# Patient Record
Sex: Male | Born: 1955 | Race: Asian | Hispanic: No | Marital: Married | Smoking: Never smoker
Health system: Southern US, Community
[De-identification: ages and names within clinical notes are randomized; demographics above are authoritative.]

## PROBLEM LIST (undated history)

## (undated) ENCOUNTER — Ambulatory Visit

## (undated) ENCOUNTER — Encounter

## (undated) DIAGNOSIS — E785 Hyperlipidemia, unspecified: Secondary | ICD-10-CM

## (undated) DIAGNOSIS — K219 Gastro-esophageal reflux disease without esophagitis: Secondary | ICD-10-CM

## (undated) DIAGNOSIS — I2510 Atherosclerotic heart disease of native coronary artery without angina pectoris: Secondary | ICD-10-CM

## (undated) HISTORY — DX: Atherosclerotic heart disease of native coronary artery without angina pectoris: I25.10

## (undated) HISTORY — DX: Hyperlipidemia, unspecified: E78.5

## (undated) HISTORY — PX: DOPPLER ECHOCARDIOGRAPHY: SHX263

## (undated) HISTORY — DX: Gastro-esophageal reflux disease without esophagitis: K21.9

---

## 2005-09-25 ENCOUNTER — Ambulatory Visit: Payer: Self-pay | Admitting: Nurse Practitioner

## 2005-09-29 ENCOUNTER — Ambulatory Visit: Payer: Self-pay | Admitting: *Deleted

## 2005-10-10 ENCOUNTER — Ambulatory Visit: Payer: Self-pay | Admitting: Nurse Practitioner

## 2009-01-12 HISTORY — PX: OTHER SURGICAL HISTORY: SHX169

## 2009-01-26 ENCOUNTER — Telehealth (INDEPENDENT_AMBULATORY_CARE_PROVIDER_SITE_OTHER): Payer: Self-pay | Admitting: *Deleted

## 2009-01-27 ENCOUNTER — Inpatient Hospital Stay (HOSPITAL_COMMUNITY): Admission: AD | Admit: 2009-01-27 | Discharge: 2009-02-06 | Payer: Self-pay | Admitting: Cardiology

## 2009-01-27 ENCOUNTER — Encounter: Payer: Self-pay | Admitting: Internal Medicine

## 2009-01-27 ENCOUNTER — Ambulatory Visit: Payer: Self-pay | Admitting: Cardiology

## 2009-01-27 ENCOUNTER — Ambulatory Visit: Payer: Self-pay

## 2009-01-27 DIAGNOSIS — R079 Chest pain, unspecified: Secondary | ICD-10-CM | POA: Insufficient documentation

## 2009-01-27 DIAGNOSIS — I200 Unstable angina: Secondary | ICD-10-CM | POA: Insufficient documentation

## 2009-01-28 ENCOUNTER — Encounter: Payer: Self-pay | Admitting: Cardiology

## 2009-01-28 ENCOUNTER — Ambulatory Visit: Payer: Self-pay | Admitting: Cardiothoracic Surgery

## 2009-01-29 ENCOUNTER — Encounter: Payer: Self-pay | Admitting: Cardiothoracic Surgery

## 2009-01-29 ENCOUNTER — Encounter: Payer: Self-pay | Admitting: Cardiology

## 2009-02-22 ENCOUNTER — Telehealth: Payer: Self-pay | Admitting: Cardiology

## 2009-02-23 ENCOUNTER — Ambulatory Visit: Payer: Self-pay | Admitting: Cardiology

## 2009-02-23 DIAGNOSIS — I25810 Atherosclerosis of coronary artery bypass graft(s) without angina pectoris: Secondary | ICD-10-CM | POA: Insufficient documentation

## 2009-02-23 DIAGNOSIS — E785 Hyperlipidemia, unspecified: Secondary | ICD-10-CM | POA: Insufficient documentation

## 2009-02-24 ENCOUNTER — Encounter: Payer: Self-pay | Admitting: Cardiology

## 2009-03-01 ENCOUNTER — Encounter: Admission: RE | Admit: 2009-03-01 | Discharge: 2009-03-01 | Payer: Self-pay | Admitting: Cardiothoracic Surgery

## 2009-03-01 ENCOUNTER — Ambulatory Visit: Payer: Self-pay | Admitting: Cardiothoracic Surgery

## 2009-03-01 ENCOUNTER — Encounter: Payer: Self-pay | Admitting: Cardiology

## 2009-03-09 ENCOUNTER — Ambulatory Visit: Payer: Self-pay | Admitting: Cardiology

## 2009-03-12 LAB — CONVERTED CEMR LAB
ALT: 15 units/L (ref 0–53)
AST: 18 units/L (ref 0–37)
Albumin: 3.8 g/dL (ref 3.5–5.2)
Alkaline Phosphatase: 62 units/L (ref 39–117)
CO2: 29 meq/L (ref 19–32)
Calcium: 9.1 mg/dL (ref 8.4–10.5)
Cholesterol: 105 mg/dL (ref 0–200)
Creatinine, Ser: 0.9 mg/dL (ref 0.4–1.5)
GFR calc non Af Amer: 94.37 mL/min (ref >60)
HDL: 30.8 mg/dL — ABNORMAL LOW (ref >39.00)
Hgb A1c MFr Bld: 5.5 % (ref 4.6–6.5)
Sodium: 140 meq/L (ref 135–145)
Total CHOL/HDL Ratio: 3
Total Protein: 6.7 g/dL (ref 6.0–8.3)
Triglycerides: 65 mg/dL (ref 0.0–149.0)

## 2009-03-15 ENCOUNTER — Telehealth (INDEPENDENT_AMBULATORY_CARE_PROVIDER_SITE_OTHER): Payer: Self-pay | Admitting: *Deleted

## 2009-03-18 ENCOUNTER — Encounter (HOSPITAL_COMMUNITY): Admission: RE | Admit: 2009-03-18 | Discharge: 2009-06-16 | Payer: Self-pay | Admitting: Cardiology

## 2009-03-29 ENCOUNTER — Telehealth (INDEPENDENT_AMBULATORY_CARE_PROVIDER_SITE_OTHER): Payer: Self-pay | Admitting: *Deleted

## 2009-04-02 ENCOUNTER — Encounter: Payer: Self-pay | Admitting: Cardiology

## 2009-04-05 ENCOUNTER — Telehealth: Payer: Self-pay | Admitting: Cardiology

## 2009-04-23 ENCOUNTER — Encounter: Payer: Self-pay | Admitting: Cardiology

## 2009-05-21 ENCOUNTER — Encounter: Payer: Self-pay | Admitting: Cardiology

## 2009-05-26 ENCOUNTER — Ambulatory Visit: Payer: Self-pay | Admitting: Cardiology

## 2009-06-08 ENCOUNTER — Telehealth: Payer: Self-pay | Admitting: Cardiology

## 2009-06-17 ENCOUNTER — Encounter (HOSPITAL_COMMUNITY): Admission: RE | Admit: 2009-06-17 | Discharge: 2009-08-10 | Payer: Self-pay | Admitting: Cardiology

## 2009-08-12 ENCOUNTER — Encounter: Payer: Self-pay | Admitting: Cardiology

## 2009-09-30 ENCOUNTER — Encounter (INDEPENDENT_AMBULATORY_CARE_PROVIDER_SITE_OTHER): Payer: Self-pay | Admitting: *Deleted

## 2009-11-08 ENCOUNTER — Encounter: Payer: Self-pay | Admitting: Cardiology

## 2009-11-08 ENCOUNTER — Ambulatory Visit: Payer: Self-pay

## 2010-01-19 ENCOUNTER — Telehealth: Payer: Self-pay | Admitting: Cardiology

## 2010-02-03 ENCOUNTER — Ambulatory Visit: Payer: Self-pay | Admitting: Cardiology

## 2010-02-03 ENCOUNTER — Encounter (INDEPENDENT_AMBULATORY_CARE_PROVIDER_SITE_OTHER): Payer: Self-pay | Admitting: *Deleted

## 2010-09-13 NOTE — Miscellaneous (Signed)
Summary: [facility]  [facility]   Imported By: Roderic Ovens 08/31/2009 13:15:41  _____________________________________________________________________  External Attachment:    Type:   Image     Comment:   External Document

## 2010-09-13 NOTE — Assessment & Plan Note (Signed)
Summary: f41m   Primary Provider:  Eric Form, MD  CC:  check up.  History of Present Illness: 55 yo with h/o hyperlipidemia and CAD s/p CABG returns for evaluation.  He is doing well post-operatively.  He completed cardiac rehab.   He denies chest pain or shortness of breath with exertion.  No lightheadedness, no claudication.  He is tolerating his medications.  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  No medical complications while abroad.  Dr. [name] has been following his lipids and recently started him on niacin.  He has been walking for exercise and wants to start increase his activity level.   Labs (7/10): LDL 61, HDL 31 Labs (1/11): TSH normal, K 4.6, creatinine 0.9, LDL 60, HDL 34, TG 65  Current Medications (verified): 1)  Lipitor 80 Mg Tabs (Atorvastatin Calcium) .Marland Kitchen.. 1 Daily 2)  Multivitamins  Tabs (Multiple Vitamin) .... Once Daily 3)  Aspirin 325 Mg  Tabs (Aspirin) .... As Needed 4)  Metformin Hcl 500 Mg Tabs (Metformin Hcl) .... As Needed 5)  Metoprolol Tartrate 25 Mg Tabs (Metoprolol Tartrate) .Marland Kitchen.. 1 Twice A Day 6)  Fish Oil   Oil (Fish Oil) .... Daily 7)  Vitamin E 400 Unit Caps (Vitamin E) .Marland Kitchen.. 1 Tab By Mouth Once Daily 8)  Vitamin C 1000 Mg Tabs (Ascorbic Acid) .Marland Kitchen.. 1 Tab By Mouth Once Daily 9)  Vitamin D 1000 Unit  Tabs (Cholecalciferol) .... Daily 10)  Lisinopril 2.5 Mg Tabs (Lisinopril) .Marland Kitchen.. 1 Daily 11)  Nitroglycerin 0.4 Mg Subl (Nitroglycerin) .Marland Kitchen.. 1 Under Your Tongue As Needed For Chest Pain-Can Use 1 Every 5 Minutes  X3  Allergies: No Known Drug Allergies  Past History:  Past Medical History: 1.  CAD:  ETT-myoview 01/27/09: large basal to apical anterior and anteroseptal reversible defect.  ECG also positive for ischemia.  LHC (6/10) showed EF 65%, totally occluded LAD with collaterals from RCA, 30-40% proximal CFX stenosis.  Patient had CABG by Dr. Tyrone Sage: LIMA-LAD, SVG-D2.   2.  Hyperlipidemia 3.  Type II  diabetes: diet-controlled 4.  GERD 5.  Echo (6/10): eF 60-65%, No AS/AI, no MR, normal RV.   Family History: Reviewed history from 02/23/2009 and no changes required. Brother with MI at 52, mother with CVA at 10 Positive for coronary artery disease  Social History: Reviewed history from 02/23/2009 and no changes required. 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 Originally from [place] in [place].  Married with 1 son. Nonsmoker, no ETOH.   Review of Systems       All systems reviewed and negative except as per HPI.   Vital Signs:  Patient profile:   55 year old male Height:      62 inches Weight:      163 pounds BMI:     29.92 Pulse rate:   56 / minute Resp:     14 per minute BP sitting:   126 / 70  (left arm)  Vitals Entered By: Kem Parkinson (November 08, 2009 1:42 PM)  Physical Exam  General:  Well developed, well nourished, in no acute distress. Neck:  Neck supple, no JVD. No masses, thyromegaly or abnormal cervical nodes. Lungs:  Clear bilaterally to auscultation and percussion. Heart:  Non-displaced PMI; regular rate and rhythm, S1, S2 without murmurs, rubs or gallops. Carotid upstroke normal, no bruit.  Pedals normal pulses. No edema, no varicosities. Abdomen:  Bowel sounds positive; abdomen soft and  non-tender without masses, organomegaly, or hernias noted. No hepatosplenomegaly. Extremities:  No clubbing or cyanosis. Neurologic:  Alert and oriented x 3. Psych:  Normal affect.   Impression & Recommendations:  Problem # 1:  CAD, ARTERY BYPASS GRAFT (ICD-414.04) Stable, no ischemic symptoms.  Continue ASA, metoprolol, statin, lisinopril. EF preserved by echo.  OK to increase physical activity (wants to do some running), but take things slowly and gradual.   Problem # 2:  HYPERLIPIDEMIA-MIXED (ICD-272.4) Recently started niacin for low HDL.  LDL has been at goal (< 70).    Patient Instructions: 1)  Your physician wants you  to follow-up in: 6 months with Dr Shirlee Latch.   You will receive a reminder letter in the mail two months in advance. If you don't receive a letter, please call our office to schedule the follow-up appointment.

## 2010-09-13 NOTE — Progress Notes (Signed)
Summary: Pt request call  Phone Note Call from Patient Call back at Home Phone [phone]   Caller: Patient Summary of Call: Pt request call did not give information Initial call taken by: Judie Grieve,  January 19, 2010 9:15 AM  Follow-up for Phone Call        spoke with pt.  Pt.states is taken Metoprolol 25 mg since pt. had heart surgery . The medication has worked find except for having some  tiredness some times , but not that bad. Pt.  was seen by his PCP yesterday  which recommeded for pt . to take Bystolic 5 mg instead of Metoprolol 25 mg. Pt states he took one tablet last evening at 7:00 PM. Pt states he  a bad reaction from medication: Dizziness, pulse was in the low 40's GI upsent tiredness.  Pt. wanted to know if he can go back to Metoprolol 25 mg. I adviced pt. to not to take Bystolic and go back to Metoprolol and keep the appointmen with Dr. Pacer Dorn  onJune 23rd at 8:15 AM. Pt verbalized understanding.  Follow-up by: Ollen Gross, RN, BSN,  January 19, 2010 10:16 AM

## 2010-09-13 NOTE — Letter (Signed)
Summary: Appointment - Reminder 2  [facility], Main Office  [address]   [place], [place] [address]   Phone: [phone]  Fax: [phone]     September 30, 2009 MRN: [id number]   [name] [address] [place], [place]  [address]   Dear [name],  Our records indicate that it is time to schedule a follow-up appointment with Dr. Shirlee Latch. It is very important that we reach you to schedule this appointment. We look forward to participating in your health care needs. Please contact us at the number listed above at your earliest convenience to schedule your appointment.  If you are unable to make an appointment at this time, give [place] a call so we can update our records.   Sincerely,  Migdalia Dk [facility] Scheduling Team

## 2010-09-13 NOTE — Assessment & Plan Note (Signed)
Summary: rov/ pt will be leaving prior to aug so needed sooner appt/lg   Primary Provider:  Eric Form, MD  CC:  check up.  History of Present Illness: 55 yo with h/o hyperlipidemia and CAD s/p CABG returns for evaluation.  He is doing well post-operatively.  He completed cardiac rehab.   He denies chest pain or shortness of breath with exertion.  He is walking 1/2 hour daily at least on his treadmill.  No lightheadedness, no claudication.  He feels a bit fatigued and tired but this does not limit his activity.  Dr. [name] switched him to [facility] from metoprolol recently but he felt more fatigued and noted that his pulse was running in the 40s.  He switched back to metoprolol 25 mg two times a day and HR has been running in the 50s-60s most of the time but occasionally in the 40s at night when he checks.    Labs (7/10): LDL 61, HDL 31 Labs (1/11): TSH normal, K 4.6, creatinine 0.9, LDL 60, HDL 34, TG 65  ECG: NSR, T wave inversion AVL  Current Medications (verified): 1)  Lipitor 80 Mg Tabs (Atorvastatin Calcium) .Marland Kitchen.. 1 Daily 2)  Multivitamins  Tabs (Multiple Vitamin) .... Once Daily 3)  Aspirin 325 Mg  Tabs (Aspirin) .... One Tablet Daily 4)  Metformin Hcl 500 Mg Tabs (Metformin Hcl) .... As Needed 5)  Metoprolol Tartrate 25 Mg Tabs (Metoprolol Tartrate) .Marland Kitchen.. 1 Twice A Day 6)  Fish Oil   Oil (Fish Oil) .... Daily 7)  Vitamin E 400 Unit Caps (Vitamin E) .Marland Kitchen.. 1 Tab By Mouth Once Daily 8)  Vitamin C 1000 Mg Tabs (Ascorbic Acid) .Marland Kitchen.. 1 Tab By Mouth Once Daily 9)  Vitamin D 1000 Unit  Tabs (Cholecalciferol) .... Daily 10)  Lisinopril 2.5 Mg Tabs (Lisinopril) .Marland Kitchen.. 1 Daily 11)  Nitroglycerin 0.4 Mg Subl (Nitroglycerin) .Marland Kitchen.. 1 Under Your Tongue As Needed For Chest Pain-Can Use 1 Every 5 Minutes  X3 12)  Niaspan 1000 Mg Cr-Tabs (Niacin (Antihyperlipidemic)) .... Daily 13)  Prevacid 30 Mg Cpdr (Lansoprazole) .... As Needed  Allergies: No Known Drug Allergies  Past History:  Past Medical  History: 1.  CAD:  ETT-myoview 01/27/09: large basal to apical anterior and anteroseptal reversible defect.  ECG also positive for ischemia.  LHC (6/10) showed EF 65%, totally occluded LAD with collaterals from RCA, 30-40% proximal CFX stenosis.  Patient had CABG by Dr. Tyrone Sage: LIMA-LAD, SVG-D2.   2.  Hyperlipidemia 3.  Type II diabetes: diet-controlled 4.  GERD 5.  Echo (6/10): EF 60-65%, No AS/AI, no MR, normal RV.   Family History: Reviewed history from 02/23/2009 and no changes required. Brother with MI at 67, mother with CVA at 76 Positive for coronary artery disease  Social History: Reviewed history from 02/23/2009 and no changes required. 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 Originally from [place] in [place].  Married with 1 son. Nonsmoker, no ETOH.   Review of Systems       All systems reviewed and negative except as per HPI.   Vital Signs:  Patient profile:   55 year old male Height:      62 inches Weight:      160 pounds BMI:     29.37 Pulse rate:   57 / minute Resp:     14 per minute BP sitting:   111 / 70  (left arm)  Vitals Entered By: Kem Parkinson (February 03, 2010 8:21 AM)  Physical Exam  General:  Well developed, well nourished, in no acute distress. Neck:  Neck supple, no JVD. No masses, thyromegaly or abnormal cervical nodes. Lungs:  Clear bilaterally to auscultation and percussion. Heart:  Non-displaced PMI; regular rate and rhythm, S1, S2 without murmurs, rubs or gallops. Carotid upstroke normal, no bruit.  Pedals normal pulses. No edema, no varicosities. Abdomen:  Bowel sounds positive; abdomen soft and non-tender without masses, organomegaly, or hernias noted. No hepatosplenomegaly. Extremities:  No clubbing or cyanosis. Neurologic:  Alert and oriented x 3. Skin:  Well-healed sternotomy Psych:  Normal affect.   Impression & Recommendations:  Problem # 1:  CAD, ARTERY BYPASS GRAFT (ICD-414.04) Stable, no  ischemic symptoms.  Continue ASA, beta blocker, statin, lisinopril. EF preserved by echo.  Continue regular exercise.  Given mild bradycardia and ongoing fatigue, I will decrease his metoprolol back to 12.5 mg two times a day.    Problem # 2:  HYPERLIPIDEMIA-MIXED (ICD-272.4) LDL at goal when last checked.  He will be getting lipids at Dr. Alver Fisher office and we will request a copy when done.   Patient Instructions: 1)  Your physician recommends that you schedule a follow-up appointment in: JAN 2012 2)  Your physician has recommended you make the following change in your medication: DECREASE METOPROLOL TO 1/2 TABLET TWICE DAILY Prescriptions: METOPROLOL TARTRATE 25 MG TABS (METOPROLOL TARTRATE) 1/2 TABLET TWICE DAILY  #30 x 12   Entered by:   Deliah Goody, RN   Authorized by:   Marca Ancona, MD   Signed by:   Deliah Goody, RN on 02/03/2010   Method used:   Electronically to        [facility] [address] (retail)       [phone]       [place], [place]  [address]       Ph: [phone]       Fax: [phone]   RxID:   [id number]

## 2010-09-13 NOTE — Miscellaneous (Signed)
  Clinical Lists Changes  Medications: Added new medication of EXACTECH TEST  STRP (GLUCOSE BLOOD) AS DIRECTED - Signed Rx of EXACTECH TEST  STRP (GLUCOSE BLOOD) AS DIRECTED;  #200 x 6;  Signed;  Entered by: Deliah Goody, RN;  Authorized by: Marca Ancona, MD;  Method used: Electronically to [facility] [address] [phone] [address] [place], [place], [place]  [address], Ph: [phone], Fax: [phone]    Prescriptions: EXACTECH TEST  STRP (GLUCOSE BLOOD) AS DIRECTED  #200 x 6   Entered by:   Deliah Goody, RN   Authorized by:   Marca Ancona, MD   Signed by:   Deliah Goody, RN on 02/03/2010   Method used:   Electronically to        [facility] [address] (retail)       [phone]       [place], [place]  [address]       Ph: [phone]       Fax: [phone]   RxID:   [id number]

## 2010-09-21 ENCOUNTER — Ambulatory Visit: Payer: Self-pay | Admitting: Cardiology

## 2010-09-22 ENCOUNTER — Ambulatory Visit (INDEPENDENT_AMBULATORY_CARE_PROVIDER_SITE_OTHER): Payer: Self-pay | Admitting: Cardiology

## 2010-09-22 ENCOUNTER — Encounter: Payer: Self-pay | Admitting: Cardiology

## 2010-09-22 DIAGNOSIS — I2510 Atherosclerotic heart disease of native coronary artery without angina pectoris: Secondary | ICD-10-CM

## 2010-09-29 NOTE — Assessment & Plan Note (Signed)
Summary: fu appt per pt/mt  confirm appt=mj   Primary Provider:  Eric Form, MD  CC:  chest discomfort Sunday--using Prevacid regularly since Sunday--no recurrence of chest discomfort.  History of Present Illness: 55 yo with h/o hyperlipidemia and CAD s/p CABG returns for evaluation.  In general, he has done quite well since his operation.  He is now walking 30-60 minutes daily on the treadmill.  No exertional chest pain or shortness of breath.  About a week ago, he started doing crunches and adding some weight training to his exercise regimen.  The next day, he felt soreness across his chest and radiating to his left arm.  This continued on and off for 2-3 days and has now resolved.  The pain was not exertional.  He also reports a lot of belching and gas during the same period that resolved with omeprazole.    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  He contacted [employer] before leaving to tell them that he would be covered by additional insurance in [place].  They suggested that he cancel his [place] policy and reapply to resume it when he returned.  When he returned and reapplied, [employer] refused his application.  He now has no insurance.   Labs (7/10): LDL 61, HDL 31 Labs (1/11): TSH normal, K 4.6, creatinine 0.9, LDL 60, HDL 34, TG 65  ECG: NSR, normal  Current Medications (verified): 1)  Lipitor 80 Mg Tabs (Atorvastatin Calcium) .... 1 Daily 2)  Multivitamins  Tabs (Multiple Vitamin) .... Once Daily 3)  Aspirin 325 Mg  Tabs (Aspirin) .... One Tablet Daily 4)  Metformin Hcl 500 Mg Tabs (Metformin Hcl) .... As Needed 5)  Metoprolol Tartrate 25 Mg Tabs (Metoprolol Tartrate) .... 1/2 Tablet Twice Daily 6)  Vitamin C 1000 Mg Tabs (Ascorbic Acid) .... 1 Tab By Mouth Once Daily 7)  Vitamin D 1000 Unit  Tabs (Cholecalciferol) .... Daily 8)  Lisinopril 2.5 Mg Tabs (Lisinopril) .... 1 Daily 9)  Nitroglycerin 0.4 Mg  Subl (Nitroglycerin) .... 1 Under Your Tongue As Needed For Chest Pain-Can Use 1 Every 5 Minutes  X3 10)  Niaspan 500 Mg Cr-Tabs (Niacin (Antihyperlipidemic)) .... Two Daily 11)  Prevacid 30 Mg Cpdr (Lansoprazole) .... As Needed 12)  Exactech Test  Strp (Glucose Blood) .... As Directed  Allergies: No Known Drug Allergies  Past History:  Past Medical History: Reviewed history from 02/03/2010 and no changes required. 1.  CAD:  ETT-myoview 01/27/09: large basal to apical anterior and anteroseptal reversible defect.  ECG also positive for ischemia.  LHC (6/10) showed EF 65%, totally occluded LAD with collaterals from RCA, 30-40% proximal CFX stenosis.  Patient had CABG by Dr. Gerhardt: LIMA-LAD, SVG-D2.   2.  Hyperlipidemia 3.  Type II diabetes: diet-controlled 4.  GERD 5.  Echo (6/10): EF 60-65%, No AS/AI, no MR, normal RV.   Family History: Reviewed history from 02/23/2009 and no changes required. Brother with MI at 48, mother with CVA at 51 Positive for coronary artery disease  Social History: Reviewed history from 02/23/2009 and no changes required. 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 Originally from [place] in [place].  Married with 1 son. Nonsmoker, no ETOH.   Vital Signs:  Patient profile:   55 year old male Height:      65 inches Weight:      16 6.50 pounds Pulse rate:  57 / minute BP sitting:   136 / 74  (left arm) Cuff size:   regular  Vitals Entered By: Katina Dung, RN, BSN (September 22, 2010 8:51 AM)  Physical Exam  General:  Well developed, well nourished, in no acute distress. Neck:  Neck supple, no JVD. No masses, thyromegaly or abnormal cervical nodes. Lungs:  Clear bilaterally to auscultation and percussion. Heart:  Non-displaced PMI; regular rate and rhythm, S1, S2 without murmurs, rubs or gallops. Carotid upstroke normal, no bruit.  Pedals normal pulses. No edema, no varicosities. Abdomen:  Bowel sounds positive;  abdomen soft and non-tender without masses, organomegaly, or hernias noted. No hepatosplenomegaly. Extremities:  No clubbing or cyanosis. Neurologic:  Alert and oriented x 3. Psych:  Normal affect.   Impression & Recommendations:  Problem # 1:  CAD, ARTERY BYPASS GRAFT (ICD-414.04) In general, he has done quite well since bypass and is doing a lot of exercise.  He seems to have excellent exercise tolerance.  He had an episode of on and off nonexertional chest soreness that lasted for a couple of days recently and may have been muscle soreness with a new exercise regimen.  I did suggest that we do an ETT.  We will need to look into options for this, I will see if he can get some financial assistance through [facility].  He will continue ASA, Lipitor, metoprolol, and lisinopril.   Problem # 2:  HYPERLIPIDEMIA-MIXED (ICD-272.4) Need to check lipids/LFTs.  Will wait until next month as he may re-acquire insurance coverage by that time.   Other Orders: Treadmill (Treadmill)  Patient Instructions: 1)  Your physician recommends that you return for a FASTING lipid profile/liver profile/BMP/HGB A1C--414.05  this can be scheduled in March 2012 2)  Your physician has requested that you have an exercise tolerance test.  For further information please visit [email or web address]  Please also follow instruction sheet, as given. Prescriptions: NIASPAN 500 MG CR-TABS (NIACIN (ANTIHYPERLIPIDEMIC)) two daily  #60 x 6   Entered by:   Katina Dung, RN, BSN   Authorized by:   Marca Ancona, MD   Signed by:   Katina Dung, RN, BSN on 09/22/2010   Method used:   Electronically to        [facility] (retail)       [phone]       [place], [place]  [address]       Ph: [phone]       Fax: [phone]   RxID:   [id number] LISINOPRIL 2.5 MG TABS (LISINOPRIL) 1 daily  #30 x 6   Entered by:   Katina Dung, RN, BSN   Authorized by:   Marca Ancona, MD    Signed by:   Katina Dung, RN, BSN on 09/22/2010   Method used:   Electronically to        [facility] (retail)       [phone]       [place], [place]  [address]       Ph: [phone]       Fax: [phone]   RxID:   [id number] METOPROLOL TARTRATE 25 MG TABS (METOPROLOL TARTRATE) 1/2 TABLET TWICE DAILY  #30 x 6   Entered by:   Katina Dung, RN, BSN   Authorized by:   Marca Ancona, MD   Signed by:   Katina Dung, RN, BSN on  09/22/2010   Method used:   Electronically to        [facility] [address] (retail)       [phone]       [place], [place]  [address]       Ph: [phone]       Fax: [phone]   RxID:   [id number] LIPITOR 80 MG TABS (ATORVASTATIN CALCIUM) 1 daily  #30 x 6   Entered by:   Katina Dung, RN, BSN   Authorized by:   Marca Ancona, MD   Signed by:   Katina Dung, RN, BSN on 09/22/2010   Method used:   Electronically to        [facility] (retail)       [phone]       [place], [place]  [address]       Ph: [phone]       Fax: [phone]   RxID:   [id number]     Vital Signs:  Patient profile:   55 year old male Height:      65 inches Weight:      166.50 pounds Pulse rate:   57 / minute BP sitting:   136 / 74  (left arm) Cuff size:   regular  Vitals Entered By: Katina Dung, RN, BSN (September 22, 2010 8:51 AM)

## 2010-10-18 ENCOUNTER — Encounter (INDEPENDENT_AMBULATORY_CARE_PROVIDER_SITE_OTHER): Payer: Self-pay | Admitting: Cardiology

## 2010-10-18 ENCOUNTER — Other Ambulatory Visit (INDEPENDENT_AMBULATORY_CARE_PROVIDER_SITE_OTHER): Payer: Self-pay

## 2010-10-18 ENCOUNTER — Encounter: Payer: Self-pay | Admitting: Cardiology

## 2010-10-18 ENCOUNTER — Other Ambulatory Visit: Payer: Self-pay | Admitting: Cardiology

## 2010-10-18 DIAGNOSIS — R079 Chest pain, unspecified: Secondary | ICD-10-CM

## 2010-10-18 DIAGNOSIS — I25810 Atherosclerosis of coronary artery bypass graft(s) without angina pectoris: Secondary | ICD-10-CM

## 2010-10-18 DIAGNOSIS — E119 Type 2 diabetes mellitus without complications: Secondary | ICD-10-CM

## 2010-10-18 LAB — LIPID PANEL
HDL: 38.9 mg/dL — ABNORMAL LOW (ref >39.00)
LDL Cholesterol: 63 mg/dL (ref 0–99)
VLDL: 8.2 mg/dL (ref 0.0–40.0)

## 2010-10-18 LAB — BASIC METABOLIC PANEL
Calcium: 9.2 mg/dL (ref 8.4–10.5)
GFR: 101.55 mL/min (ref >60.00)
Glucose, Bld: 110 mg/dL — ABNORMAL HIGH (ref 70–99)
Potassium: 5 mEq/L (ref 3.5–5.1)
Sodium: 141 mEq/L (ref 135–145)

## 2010-10-18 LAB — HEPATIC FUNCTION PANEL
AST: 25 U/L (ref 0–37)
Albumin: 4.4 g/dL (ref 3.5–5.2)
Alkaline Phosphatase: 56 U/L (ref 39–117)
Bilirubin, Direct: 0.3 mg/dL (ref 0.0–0.3)
Total Bilirubin: 0.9 mg/dL (ref 0.3–1.2)

## 2010-10-18 LAB — HEMOGLOBIN A1C: Hgb A1c MFr Bld: 6.4 % (ref 4.6–6.5)

## 2010-10-20 ENCOUNTER — Telehealth: Payer: Self-pay | Admitting: Cardiology

## 2010-10-25 NOTE — Progress Notes (Signed)
Summary: Lipitor 90 day from pt assistance prorgram  Phone Note Outgoing Call   Call placed by: Katina Dung, RN, BSN,  October 20, 2010 11:49 AM Call placed to: Patient Summary of Call: Lipitor 80  Follow-up for Phone Call        stock bottle of Lipitor 80 #90 from pt assistance program left at desk for pt to pickup --pt is aware-pt is aware he should call me when he has a 30day supply left so I can reorder

## 2010-11-19 LAB — GLUCOSE, CAPILLARY: Glucose-Capillary: 95 mg/dL (ref 70–99)

## 2010-11-21 LAB — HEMOGLOBIN AND HEMATOCRIT, BLOOD
HCT: 25.4 % — ABNORMAL LOW (ref 39.0–52.0)
Hemoglobin: 8.5 g/dL — ABNORMAL LOW (ref 13.0–17.0)

## 2010-11-21 LAB — GLUCOSE, CAPILLARY
Glucose-Capillary: 108 mg/dL — ABNORMAL HIGH (ref 70–99)
Glucose-Capillary: 112 mg/dL — ABNORMAL HIGH (ref 70–99)
Glucose-Capillary: 117 mg/dL — ABNORMAL HIGH (ref 70–99)
Glucose-Capillary: 117 mg/dL — ABNORMAL HIGH (ref 70–99)
Glucose-Capillary: 132 mg/dL — ABNORMAL HIGH (ref 70–99)
Glucose-Capillary: 135 mg/dL — ABNORMAL HIGH (ref 70–99)
Glucose-Capillary: 138 mg/dL — ABNORMAL HIGH (ref 70–99)
Glucose-Capillary: 142 mg/dL — ABNORMAL HIGH (ref 70–99)
Glucose-Capillary: 152 mg/dL — ABNORMAL HIGH (ref 70–99)
Glucose-Capillary: 154 mg/dL — ABNORMAL HIGH (ref 70–99)
Glucose-Capillary: 165 mg/dL — ABNORMAL HIGH (ref 70–99)
Glucose-Capillary: 92 mg/dL (ref 70–99)
Glucose-Capillary: 93 mg/dL (ref 70–99)
Glucose-Capillary: 95 mg/dL (ref 70–99)
Glucose-Capillary: 99 mg/dL (ref 70–99)

## 2010-11-21 LAB — CBC
HCT: 29.7 % — ABNORMAL LOW (ref 39.0–52.0)
HCT: 30.1 % — ABNORMAL LOW (ref 39.0–52.0)
HCT: 30.2 % — ABNORMAL LOW (ref 39.0–52.0)
HCT: 30.7 % — ABNORMAL LOW (ref 39.0–52.0)
HCT: 31.1 % — ABNORMAL LOW (ref 39.0–52.0)
HCT: 31.2 % — ABNORMAL LOW (ref 39.0–52.0)
HCT: 32.7 % — ABNORMAL LOW (ref 39.0–52.0)
HCT: 44.8 % (ref 39.0–52.0)
HCT: 46.6 % (ref 39.0–52.0)
Hemoglobin: 10.2 g/dL — ABNORMAL LOW (ref 13.0–17.0)
Hemoglobin: 10.2 g/dL — ABNORMAL LOW (ref 13.0–17.0)
Hemoglobin: 10.2 g/dL — ABNORMAL LOW (ref 13.0–17.0)
Hemoglobin: 10.3 g/dL — ABNORMAL LOW (ref 13.0–17.0)
Hemoglobin: 10.5 g/dL — ABNORMAL LOW (ref 13.0–17.0)
Hemoglobin: 10.6 g/dL — ABNORMAL LOW (ref 13.0–17.0)
Hemoglobin: 11 g/dL — ABNORMAL LOW (ref 13.0–17.0)
Hemoglobin: 15.6 g/dL (ref 13.0–17.0)
MCHC: 33.2 g/dL (ref 30.0–36.0)
MCHC: 33.5 g/dL (ref 30.0–36.0)
MCHC: 33.6 g/dL (ref 30.0–36.0)
MCHC: 33.8 g/dL (ref 30.0–36.0)
MCHC: 33.8 g/dL (ref 30.0–36.0)
MCHC: 33.9 g/dL (ref 30.0–36.0)
MCHC: 34 g/dL (ref 30.0–36.0)
MCHC: 34.2 g/dL (ref 30.0–36.0)
MCV: 83.1 fL (ref 78.0–100.0)
MCV: 83.1 fL (ref 78.0–100.0)
MCV: 83.3 fL (ref 78.0–100.0)
MCV: 83.5 fL (ref 78.0–100.0)
MCV: 83.6 fL (ref 78.0–100.0)
MCV: 83.6 fL (ref 78.0–100.0)
MCV: 83.8 fL (ref 78.0–100.0)
Platelets: 101 10*3/uL — ABNORMAL LOW (ref 150–400)
Platelets: 148 10*3/uL — ABNORMAL LOW (ref 150–400)
Platelets: 160 10*3/uL (ref 150–400)
Platelets: 161 10*3/uL (ref 150–400)
Platelets: 164 10*3/uL (ref 150–400)
Platelets: 183 10*3/uL (ref 150–400)
Platelets: 233 10*3/uL (ref 150–400)
Platelets: 237 10*3/uL (ref 150–400)
Platelets: 242 10*3/uL (ref 150–400)
Platelets: 311 10*3/uL (ref 150–400)
RBC: 3.55 MIL/uL — ABNORMAL LOW (ref 4.22–5.81)
RBC: 3.61 MIL/uL — ABNORMAL LOW (ref 4.22–5.81)
RBC: 3.64 MIL/uL — ABNORMAL LOW (ref 4.22–5.81)
RBC: 3.67 MIL/uL — ABNORMAL LOW (ref 4.22–5.81)
RBC: 3.72 MIL/uL — ABNORMAL LOW (ref 4.22–5.81)
RBC: 3.74 MIL/uL — ABNORMAL LOW (ref 4.22–5.81)
RBC: 3.93 MIL/uL — ABNORMAL LOW (ref 4.22–5.81)
RBC: 5.39 MIL/uL (ref 4.22–5.81)
RDW: 12.8 % (ref 11.5–15.5)
RDW: 13.3 % (ref 11.5–15.5)
RDW: 13.5 % (ref 11.5–15.5)
RDW: 13.6 % (ref 11.5–15.5)
RDW: 13.6 % (ref 11.5–15.5)
RDW: 13.7 % (ref 11.5–15.5)
RDW: 13.9 % (ref 11.5–15.5)
RDW: 13.9 % (ref 11.5–15.5)
WBC: 10.6 10*3/uL — ABNORMAL HIGH (ref 4.0–10.5)
WBC: 11.4 10*3/uL — ABNORMAL HIGH (ref 4.0–10.5)
WBC: 6.2 10*3/uL (ref 4.0–10.5)
WBC: 7.2 10*3/uL (ref 4.0–10.5)
WBC: 7.4 10*3/uL (ref 4.0–10.5)
WBC: 7.4 10*3/uL (ref 4.0–10.5)
WBC: 7.6 10*3/uL (ref 4.0–10.5)
WBC: 8.4 10*3/uL (ref 4.0–10.5)
WBC: 9.2 10*3/uL (ref 4.0–10.5)

## 2010-11-21 LAB — POCT I-STAT 4, (NA,K, GLUC, HGB,HCT)
Glucose, Bld: 112 mg/dL — ABNORMAL HIGH (ref 70–99)
Glucose, Bld: 122 mg/dL — ABNORMAL HIGH (ref 70–99)
Glucose, Bld: 130 mg/dL — ABNORMAL HIGH (ref 70–99)
Glucose, Bld: 95 mg/dL (ref 70–99)
HCT: 26 % — ABNORMAL LOW (ref 39.0–52.0)
HCT: 32 % — ABNORMAL LOW (ref 39.0–52.0)
HCT: 42 % (ref 39.0–52.0)
Hemoglobin: 13.6 g/dL (ref 13.0–17.0)
Hemoglobin: 14.3 g/dL (ref 13.0–17.0)
Hemoglobin: 8.5 g/dL — ABNORMAL LOW (ref 13.0–17.0)
Hemoglobin: 8.8 g/dL — ABNORMAL LOW (ref 13.0–17.0)
Potassium: 3.6 mEq/L (ref 3.5–5.1)
Sodium: 143 mEq/L (ref 135–145)
Sodium: 145 mEq/L (ref 135–145)

## 2010-11-21 LAB — POCT I-STAT 3, ART BLOOD GAS (G3+)
Acid-base deficit: 3 mmol/L — ABNORMAL HIGH (ref 0.0–2.0)
Bicarbonate: 22.6 mEq/L (ref 20.0–24.0)
Bicarbonate: 25.1 mEq/L — ABNORMAL HIGH (ref 20.0–24.0)
O2 Saturation: 98 %
Patient temperature: 37.4
TCO2: 27 mmol/L (ref 0–100)
pCO2 arterial: 36.7 mmHg (ref 35.0–45.0)
pCO2 arterial: 50 mmHg — ABNORMAL HIGH (ref 35.0–45.0)
pH, Arterial: 7.308 — ABNORMAL LOW (ref 7.350–7.450)
pO2, Arterial: 326 mmHg — ABNORMAL HIGH (ref 80.0–100.0)
pO2, Arterial: 86 mmHg (ref 80.0–100.0)

## 2010-11-21 LAB — BASIC METABOLIC PANEL
BUN: 10 mg/dL (ref 6–23)
BUN: 7 mg/dL (ref 6–23)
BUN: 7 mg/dL (ref 6–23)
BUN: 8 mg/dL (ref 6–23)
BUN: 9 mg/dL (ref 6–23)
CO2: 24 mEq/L (ref 19–32)
CO2: 26 mEq/L (ref 19–32)
CO2: 29 mEq/L (ref 19–32)
CO2: 32 mEq/L (ref 19–32)
Calcium: 7.6 mg/dL — ABNORMAL LOW (ref 8.4–10.5)
Calcium: 7.7 mg/dL — ABNORMAL LOW (ref 8.4–10.5)
Calcium: 8 mg/dL — ABNORMAL LOW (ref 8.4–10.5)
Calcium: 8.4 mg/dL (ref 8.4–10.5)
Calcium: 9.1 mg/dL (ref 8.4–10.5)
Calcium: 9.3 mg/dL (ref 8.4–10.5)
Chloride: 101 mEq/L (ref 96–112)
Chloride: 102 mEq/L (ref 96–112)
Chloride: 106 mEq/L (ref 96–112)
Chloride: 109 mEq/L (ref 96–112)
Creatinine, Ser: 0.75 mg/dL (ref 0.4–1.5)
Creatinine, Ser: 0.87 mg/dL (ref 0.4–1.5)
Creatinine, Ser: 0.99 mg/dL (ref 0.4–1.5)
Creatinine, Ser: 1.01 mg/dL (ref 0.4–1.5)
Creatinine, Ser: 1.09 mg/dL (ref 0.4–1.5)
GFR calc Af Amer: >60 mL/min (ref >60)
GFR calc Af Amer: >60 mL/min (ref >60)
GFR calc Af Amer: >60 mL/min (ref >60)
GFR calc Af Amer: >60 mL/min (ref >60)
GFR calc Af Amer: >60 mL/min (ref >60)
GFR calc non Af Amer: >60 mL/min (ref >60)
GFR calc non Af Amer: >60 mL/min (ref >60)
GFR calc non Af Amer: >60 mL/min (ref >60)
GFR calc non Af Amer: >60 mL/min (ref >60)
GFR calc non Af Amer: >60 mL/min (ref >60)
GFR calc non Af Amer: >60 mL/min (ref >60)
Glucose, Bld: 104 mg/dL — ABNORMAL HIGH (ref 70–99)
Glucose, Bld: 126 mg/dL — ABNORMAL HIGH (ref 70–99)
Glucose, Bld: 134 mg/dL — ABNORMAL HIGH (ref 70–99)
Glucose, Bld: 138 mg/dL — ABNORMAL HIGH (ref 70–99)
Glucose, Bld: 175 mg/dL — ABNORMAL HIGH (ref 70–99)
Potassium: 3.6 mEq/L (ref 3.5–5.1)
Potassium: 3.9 mEq/L (ref 3.5–5.1)
Potassium: 4.1 mEq/L (ref 3.5–5.1)
Potassium: 4.2 mEq/L (ref 3.5–5.1)
Potassium: 4.3 mEq/L (ref 3.5–5.1)
Sodium: 131 mEq/L — ABNORMAL LOW (ref 135–145)
Sodium: 134 mEq/L — ABNORMAL LOW (ref 135–145)
Sodium: 137 mEq/L (ref 135–145)
Sodium: 139 mEq/L (ref 135–145)
Sodium: 141 mEq/L (ref 135–145)
Sodium: 142 mEq/L (ref 135–145)

## 2010-11-21 LAB — COMPREHENSIVE METABOLIC PANEL
AST: 21 U/L (ref 0–37)
Albumin: 3.7 g/dL (ref 3.5–5.2)
Alkaline Phosphatase: 54 U/L (ref 39–117)
BUN: 10 mg/dL (ref 6–23)
CO2: 27 mEq/L (ref 19–32)
Chloride: 108 mEq/L (ref 96–112)
GFR calc Af Amer: >60 mL/min (ref >60)
GFR calc non Af Amer: >60 mL/min (ref >60)
Potassium: 4.2 mEq/L (ref 3.5–5.1)
Total Bilirubin: 1.2 mg/dL (ref 0.3–1.2)

## 2010-11-21 LAB — TYPE AND SCREEN
ABO/RH(D): B POS
Antibody Screen: NEGATIVE

## 2010-11-21 LAB — URINALYSIS, ROUTINE W REFLEX MICROSCOPIC
Bilirubin Urine: NEGATIVE
Glucose, UA: NEGATIVE mg/dL
Ketones, ur: NEGATIVE mg/dL
Protein, ur: NEGATIVE mg/dL
pH: 5.5 (ref 5.0–8.0)

## 2010-11-21 LAB — DIFFERENTIAL
Basophils Absolute: 0.1 10*3/uL (ref 0.0–0.1)
Basophils Absolute: 0.1 10*3/uL (ref 0.0–0.1)
Basophils Relative: 1 % (ref 0–1)
Basophils Relative: 1 % (ref 0–1)
Eosinophils Absolute: 0.2 10*3/uL (ref 0.0–0.7)
Eosinophils Absolute: 0.3 10*3/uL (ref 0.0–0.7)
Eosinophils Relative: 3 % (ref 0–5)
Eosinophils Relative: 5 % (ref 0–5)
Lymphocytes Relative: 21 % (ref 12–46)
Lymphs Abs: 1.6 10*3/uL (ref 0.7–4.0)
Monocytes Absolute: 0.5 10*3/uL (ref 0.1–1.0)
Monocytes Absolute: 0.8 10*3/uL (ref 0.1–1.0)
Monocytes Relative: 11 % (ref 3–12)
Neutro Abs: 4.7 10*3/uL (ref 1.7–7.7)
Neutrophils Relative %: 64 % (ref 43–77)

## 2010-11-21 LAB — CARDIAC PANEL(CRET KIN+CKTOT+MB+TROPI)
CK, MB: 1.3 ng/mL (ref 0.3–4.0)
Relative Index: 1.4 (ref 0.0–2.5)
Relative Index: INVALID (ref 0.0–2.5)
Total CK: 79 U/L (ref 7–232)
Troponin I: 0.02 ng/mL (ref 0.00–0.06)
Troponin I: 0.03 ng/mL (ref 0.00–0.06)

## 2010-11-21 LAB — CK TOTAL AND CKMB (NOT AT ARMC)
CK, MB: 7.6 ng/mL — ABNORMAL HIGH (ref 0.3–4.0)
Relative Index: 2.5 (ref 0.0–2.5)
Total CK: 308 U/L — ABNORMAL HIGH (ref 7–232)

## 2010-11-21 LAB — PROTIME-INR
INR: 1.1 (ref 0.00–1.49)
INR: 1.4 (ref 0.00–1.49)
INR: 1.9 — ABNORMAL HIGH (ref 0.00–1.49)
Prothrombin Time: 14.3 seconds (ref 11.6–15.2)
Prothrombin Time: 17.9 seconds — ABNORMAL HIGH (ref 11.6–15.2)
Prothrombin Time: 23.2 seconds — ABNORMAL HIGH (ref 11.6–15.2)

## 2010-11-21 LAB — CREATININE, SERUM
Creatinine, Ser: 0.78 mg/dL (ref 0.4–1.5)
GFR calc Af Amer: >60 mL/min (ref >60)
GFR calc non Af Amer: >60 mL/min (ref >60)

## 2010-11-21 LAB — MAGNESIUM
Magnesium: 1.7 mg/dL (ref 1.5–2.5)
Magnesium: 2.5 mg/dL (ref 1.5–2.5)

## 2010-11-21 LAB — POCT I-STAT GLUCOSE: Operator id: 198871

## 2010-11-21 LAB — BLOOD GAS, ARTERIAL
Acid-Base Excess: 0.6 mmol/L (ref 0.0–2.0)
TCO2: 25.7 mmol/L (ref 0–100)
pCO2 arterial: 38.4 mmHg (ref 35.0–45.0)
pH, Arterial: 7.422 (ref 7.350–7.450)
pO2, Arterial: 73.1 mmHg — ABNORMAL LOW (ref 80.0–100.0)

## 2010-11-21 LAB — LIPID PANEL
Cholesterol: 167 mg/dL (ref 0–200)
LDL Cholesterol: 123 mg/dL — ABNORMAL HIGH (ref 0–99)
Triglycerides: 75 mg/dL (ref <150)
VLDL: 15 mg/dL (ref 0–40)

## 2010-11-21 LAB — POCT I-STAT, CHEM 8
Hemoglobin: 13.3 g/dL (ref 13.0–17.0)
Sodium: 133 mEq/L — ABNORMAL LOW (ref 135–145)
TCO2: 25 mmol/L (ref 0–100)

## 2010-11-21 LAB — HEPARIN LEVEL (UNFRACTIONATED): Heparin Unfractionated: <0.1 IU/mL — ABNORMAL LOW (ref 0.30–0.70)

## 2010-11-21 LAB — HEMOGLOBIN A1C
Hgb A1c MFr Bld: 5.9 % (ref 4.6–6.1)
Mean Plasma Glucose: 123 mg/dL

## 2010-11-21 LAB — APTT: aPTT: 58 seconds — ABNORMAL HIGH (ref 24–37)

## 2010-11-21 LAB — PLATELET COUNT: Platelets: 190 10*3/uL (ref 150–400)

## 2010-12-12 ENCOUNTER — Telehealth: Payer: Self-pay | Admitting: Cardiology

## 2010-12-12 NOTE — Telephone Encounter (Signed)
Lipitor 80mg  #90 reordered 12/13/10  through [facility]. Order #[id number] arrive in 7-10 days. Pt is aware Lipitor reordered. Pt will be out of town and asked me to call his son at [phone]  when medication comes into our office.

## 2010-12-12 NOTE — Telephone Encounter (Signed)
Pt wanting med assistance

## 2010-12-12 NOTE — Telephone Encounter (Signed)
PT needs lipitor. Pt states nurse calls it into the lipitor company.

## 2010-12-19 ENCOUNTER — Telehealth: Payer: Self-pay | Admitting: *Deleted

## 2010-12-19 NOTE — Telephone Encounter (Signed)
Made aware lipitor at the front desk for pick up [name]

## 2010-12-27 NOTE — Op Note (Signed)
[name], [name] NO.:  [id number]   MEDICAL RECORD NO.:  [id number]          PATIENT TYPE:  INP   LOCATION:  2314                         FACILITY:  [facility]   PHYSICIAN:  Sheliah Plane, MD    DATE OF BIRTH:  Jan 08, 1956   DATE OF PROCEDURE:  02/01/2009  DATE OF DISCHARGE:                               OPERATIVE REPORT   PREOPERATIVE DIAGNOSIS:  Coronary occlusive disease with total occlusion  of the left anterior descending.   POSTOPERATIVE DIAGNOSES:  Coronary occlusive disease with total  occlusion of the left anterior descending with deeply intramyocardial  left anterior descending vessel.   PROCEDURE PERFORMED:  Coronary artery bypass grafting x2 with the left  internal mammary to the old left anterior descending coronary artery  deeply intramyocardial and reverse saphenous vein graft to the second  diagonal with right thigh endovein harvesting.   SURGEON:  Sheliah Plane, MD   FIRST ASSISTANT:  Doree Fudge, PA   BRIEF HISTORY:  The patient is a 55 year old male who presents with  increasing unstable anginal symptoms.  He underwent cardiac  catheterization by Dr. Clifton James, which demonstrated total occlusion of  the left anterior descending coronary artery with faint collateral  filling from a large right coronary artery without significant disease.  He had 30-40% narrowing of the circumflex, which was not bypassed.  The  right coronary artery was a dominant vessel.  Because of the patient's  positive stress test with significant anterior apical ischemia symptoms  and total occlusion of the left anterior descending coronary artery,  coronary artery bypass grafting was recommended to the patient for  relief of symptoms.  Risks and options were discussed with the patient  in detail.   DESCRIPTION OF PROCEDURE:  With Swan-Ganz and arterial line monitors in  place, the patient underwent general endotracheal anesthesia without  incidence.  The  skin of chest and legs were prepped with Betadine and  draped in usual sterile manner.  The left internal mammary artery was  dissected down as a pedicle graft and was hydrostatically dilated with  heparinized saline.  It was of good quality vessel for bypass.  Initially was possibility of off-pump bypass have been entertained.  The  pericardium was opened and the anterior wall of the heart was examined.  The second diagonal appeared to be large vessel to bypass; however, the  left anterior descending coronary artery was only evident on its  anterior surface of the heart very distally.  It was decided at this  point that off-pump bypass with intramyocardial left anterior descending  coronary artery was not possibility.  The patient was systemically  heparinized.  Ascending aorta was cannulated.  The right atrium was  cannulated and aortic root vent cardioplegia needle was introduced into  the ascending aorta.  The patient was placed on cardiopulmonary bypass  2.4 L per minute per meter square.  At this point, we spent a  considerable amount of time dissecting out the anterior surface of the  heart trying to locate the left anterior descending coronary artery.  The small and diagonal branch was followed until the  muscle, but still  could not locate LAD properly.  Finally after a significant amount of  time attempting, it was decided to arrest the heart and then proceed  with procedure.  Aortic crossclamp was applied and 500 mL of cold blood  potassium cardioplegia was administered, diastolic arrest of heart.  The  second diagonal vessel was opened and was just 1 mm in size.  Using a  running 8-0 Prolene, a short second reverse saphenous vein graft, which  had been taken out of the right thigh endoscopically was used to bypass  the diagonal vessel.  Still having down into the septum, significant  amount of LAD was not located.  The very distal LAD at the apex was  opened.  It was very small  at this point, but a 1-mm probe was used to  pass up the vessel in the mid LAD.  Using this as a marker, we were able  to cut down onto the left anterior descending coronary artery and it was  opened.  Using running 8-0 Prolene, the left internal mammary artery was  then anastomosed to the left anterior descending coronary artery.  With  crossclamp still in place, the vein graft to the second diagonal was  anastomosed to the ascending aorta.  Air was evacuated from the grafts  in the ascending aorta and the aortic crossclamp was removed with total  crossclamp time of 73 minutes.  The various venous bleeders were then  controlled along the dissected tract of the left anterior descending  coronary artery.  The patient spontaneously converted to a sinus rhythm.  With the field hemostatic and the patient body temperature rewarmed at  37 degrees, he was then ventilated and weaned cardiopulmonary bypass  without difficulty remaining hemodynamically stable.  He was  decannulated in usual fashion.  Protamine sulfate was administered with  operative field hemostatic.  Two atrial and two ventricular pacing wires  were applied.  Graft marker was applied.  The left pleural tube and a  Blake mediastinal drain were left in place.  Pericardium was  reapproximated.  Sternum was closed with #6 stainless steel wire.  Fascia closed with interrupted 0-Vicryl running, 3-0 Vicryl subcutaneous  tissue, 4-0 subcuticular stitch in skin edges.  Dry dressings were  applied.  Sponge and needle count was reported as correct at the  completion of procedure.  The patient tolerated the procedure without  obvious complications.  Redo coronary artery bypass grafting to either  the circumflex system which is very small or to the LAD or diagonal  would be extremely difficult because of its deep intramyocardial  position.      Sheliah Plane, MD  Electronically Signed     Sheliah Plane, MD  Electronically  Signed    EG/MEDQ  D:  02/01/2009  T:  02/02/2009  Job:  [id number]   cc:   Marca Ancona, MD

## 2010-12-27 NOTE — Consult Note (Signed)
[name], STUKEY NO.:  [id number]   MEDICAL RECORD NO.:  [id number]          PATIENT TYPE:  INP   LOCATION:  3707                         FACILITY:  [facility]   PHYSICIAN:  Sheliah Plane, MD    DATE OF BIRTH:  May 15, 1956   DATE OF CONSULTATION:  01/28/2009  DATE OF DISCHARGE:                                 CONSULTATION   REQUESTING PHYSICIAN:  Marca Ancona, MD   FOLLOWUP CARDIOLOGIST:  Marca Ancona, MD   PRIMARY CARE PHYSICIAN:  Dell Ponto MD, [facility], [employer].   REASON FOR CONSULTATION:  Coronary occlusive disease.   HISTORY OF PRESENT ILLNESS:  The patient is a 55 year old male with no  previous cardiac history who in March noted onset of substernal  discomfort exertion.  He was initially treated for pneumonia with a  course of Levaquin and then prior to leaving for a trip to [place] and  concern that he may need Levaquin again was retreated.  He made his trip  to [place], but while in the airport again had recurrent substernal  exertionally related chest discomfort.  When he returned to this country  earlier in the week, he was seen in [employer], stress test was  recommended, and he had a markedly positive stress test with prolonged  chest pain following a study with 2 mm ST depression in II, III, aVF and  V3 through V6, and did not resolve until 10 minutes after recovery.  The  Myoview images showed a large basilar apical anterior and anteroseptal  reversible defect with overall ejection fraction of 66%.  Because of the  significant response on the stress test, he was hospitalized and  underwent cardiac catheterization today by Dr. Clifton James.  He has had no  previous cardiac history.  Denies history of hypertension; does have a  known history of hyperlipidemia, treated with Lipitor; denies definite  diabetes, but notes while working and living in [place] was checked for  diabetes and told that he was pre-diabetic and was  given metformin to  take when his glucose was over 120.  He is a nonsmoker, does have a  positive family history of cardiac disease.  Denies claudication.  Denies renal insufficiency.   PAST MEDICAL HISTORY:  The patient has traveled extensively in [place] and in [place].  He notes that he no longer uses malaria  prophylaxis, but has had no history of malaria in the past.  He denies  any previous surgery.   SOCIAL HISTORY:  The patient is married, works in [employer],  which he travels overseas frequently.   MEDICATIONS AT TIME OF ADMISSION:  1. Lipitor 20 mg a day.  2. Multivitamins.  3. Aspirin 81 mg a day.  4. Vitamin C.  5. Vitamin D.  6. Vitamin E.  7. Fish oil.  8. Prevacid p.r.n.   ALLERGIES:  Denies allergies.   CARDIAC REVIEW OF SYSTEMS:  Positive for exertionally-related chest  pain, denies resting shortness of breath, exertional shortness of  breath, orthopnea, presyncope, syncope, and lower extremity edema.  He  has no  complaint of definite palpitations, but does note that he feels  his heart pounding.   GENERAL REVIEW OF SYSTEMS:  Denies constitutional symptoms.  No fever,  chills, or night sweats.  He has had no fevers consistent with malaria,  denies any change in bowel habits.  No blood in the stool or urine.  Denies amaurosis or TIAs.  He has had no change in bowel habits.  No  blood in his stool or urine.  Denies any psychiatric history.   PHYSICAL EXAMINATION:  VITAL SIGNS:  Blood pressure is 134/70, pulse is  70, and respiratory rate is 18.  The patient is 5 feet 5 inches tall and  168 pounds.  GENERAL:  The patient is awake and alert, neurologically intact.  NECK:  He has no carotid bruits.  LUNGS:  Clear to auscultation and percussion.  I do not appreciate any  murmur or mitral insufficiency, though it is noted at the time of  catheterization.  ABDOMEN:  Benign without palpable masses.  He has a dressing in the  right groin from the  catheterization.  He has femoral pulses bilaterally  as appears to have adequate vein for bypass in both lower extremities.  He has 2+ DP and PT pulses bilaterally.   Cardiac catheterization films are reviewed.  The patient appears to have  40% distal left main, total occlusion of the LAD with collaterals  filling from the right.  The circumflex is a relatively small vessel as  is the ramus without significant disease.  The right coronary artery is  a large dominant vessel with luminal irregularities; however, at the  very ostium of it, there are several injections somewhat concerned  whether this is just artifact of catheterization or whether the patient  has an ostial right coronary artery lesion.  Ejection fraction is 65%.  He does have moderate MR; however, the catheter was entangled in the  mitral valve apparatus and this might be artifactual.   IMPRESSION:  The patient with total occlusion of left anterior  descending, markedly positive stress test with symptoms, probably  catheter-induced mitral regurgitation.  Suggestions, I agree with the  patient symptoms and positive stress test.  I agreed with coronary  artery bypass grafting at least with left internal mammary to the left  anterior descending coronary artery.  The patient just had an  echocardiogram done.  We will need to review this, but I suspect that he  will not need anything done to his mitral valve.  I am concerned and we  will talk to Dr. Shirlee Latch about the ostium of the right coronary artery  whether there is a true lesion there or just artifactual contrast  streaming.  We will resolve these issues and then tentatively plan for  surgery early next week.      Sheliah Plane, MD  Electronically Signed     EG/MEDQ  D:  01/28/2009  T:  01/29/2009  Job:  [id number]   cc:   Dell Ponto, MD  Marca Ancona, MD

## 2010-12-27 NOTE — Cardiovascular Report (Signed)
NAMEJAYLUN, [name] NO.:  [id number]   MEDICAL RECORD NO.:  [id number]          PATIENT TYPE:  INP   LOCATION:  3707                         FACILITY:  [facility]   PHYSICIAN:  Verne Carrow, MDDATE OF BIRTH:  1956-07-19   DATE OF PROCEDURE:  01/28/2009  DATE OF DISCHARGE:                            CARDIAC CATHETERIZATION   PRIMARY CARDIOLOGIST:  Marca Ancona, MD   PROCEDURES PERFORMED:  1. Left heart catheterization.  2. Selective coronary angiography.  3. Left ventricular angiogram.  4. Left internal mammary artery angiography.   OPERATOR:  Verne Carrow, MD   INDICATIONS:  This is a 55 year old male with a history of  hyperlipidemia, gastroesophageal reflux disease, and borderline diabetes  mellitus, who was seen by Dr. Shirlee Latch in the office yesterday for  complaints of chest pain.  The patient had a stress test and during the  stress test, he had the onset of pain with significant ST-segment  depression in the inferior leads as well as the anterolateral leads.  His Myoview images showed a large basal apical anterior and anteroseptal  reversible defect.  Ejection fraction was 66%.  The patient was referred  today for a diagnostic left heart catheterization.  The patient was  admitted to the hospital last night.  He was not given Plavix secondary  to the possibility of proximal left anterior descending or multivessel  disease.   PROCEDURE IN DETAIL:  The patient was brought to the main cardiac  catheterization laboratory after signing informed consent for the  procedure.  The right groin was prepped and draped in sterile fashion.  Lidocaine 1% was used for local anesthesia.  A 5-French sheath was  inserted into the right femoral artery without difficulty.  A JL3.5  diagnostic catheter was used to perform selective angiography of the  left coronary system.  A JR4 diagnostic catheter was used perform  selective angiography of the right coronary  artery.  A 5-French pigtail  catheter was used to perform a left ventricular angiogram.  The left  internal mammary artery catheter was used to selectively engage and  inject the left internal mammary artery.  The patient tolerated the  procedure well and was taken to the holding area in stable condition.   HEMODYNAMIC FINDINGS:  Central aortic pressure 139/78.  Left ventricular  pressure 134/9.  Left ventricular end-diastolic pressure 19.   ANGIOGRAPHIC FINDINGS:  1. The left main coronary artery has mild plaque in the ostium and      tapers distally to 30-40% stenosis.  2. The left anterior descending has a total occlusion at the ostium.      The mid and distal LAD fills faintly from right-to-left collaterals      and left-to-left collaterals.  3. The circumflex artery is a small-caliber vessel that has plaque      disease only.  4. The ramus intermedius is a moderate-sized vessel that has mild      plaque in the proximal portion of the vessel, but no obstructive      disease.  5. The right coronary artery is a large dominant artery that has a  proximal 20% stenosis and a mid 40% stenosis.  There is a large      posterior descending branch that has no plaque disease.  There is a      large bifurcating posterolateral branch that has a 50% stenosis in      one of the subbranches.  6. Left internal mammary artery was injected and is patent without any      disease.  7. Left ventricular angiogram was performed in the RAO projection, it      shows normal left ventricular systolic function with no wall motion      abnormalities.  Ejection fraction is 65%.  There is at least      moderate mitral regurgitation noted on angiography.   IMPRESSION:  1. Single-vessel coronary artery disease.  2. Normal left ventricular systolic function.  3. Normal left ventricular filling pressures.  4. Moderate mitral regurgitation.   RECOMMENDATIONS:  This patient has a total occlusion of his  ostial left  anterior descending coronary artery.  I think the best revascularization  strategy for him would be a bypass surgery with a left internal mammary  artery graft to the LAD.  I will call CT Surgery this morning for a  consultation for possible bypass procedure.  The patient has been given  aspirin, but has not been given Plavix secondary to the possibility for  a need for bypass surgery.  I would like to continue his aspirin, beta-  blocker, and statin therapy for now.  We will check an echocardiogram  this morning to further evaluate his mitral regurgitation.      Verne Carrow, MD  Electronically Signed     CM/MEDQ  D:  01/28/2009  T:  01/28/2009  Job:  [id number]   cc:   Marca Ancona, MD

## 2010-12-27 NOTE — Discharge Summary (Signed)
NAMEOLAN, KUREK NO.:  [id number]   MEDICAL RECORD NO.:  [id number]          PATIENT TYPE:  INP   LOCATION:  2020                         FACILITY:  [facility]   PHYSICIAN:  Sheliah Plane, MD    DATE OF BIRTH:  August 15, 1955   DATE OF ADMISSION:  01/27/2009  DATE OF DISCHARGE:  02/06/2009                               DISCHARGE SUMMARY   HISTORY:  The patient is a 55 year old male with no previous cardiac  history who in March 2010 noted an onset of substernal exertional  discomfort.  He was initially treated for pneumonia with a course of  Levaquin and then prior to leaving for a trip to [place] and concerned  that he may need Levaquin again, he was retreated.  He made his trip to  [place], but while in the airport again, he had recurrence substernal  exertional related chest discomfort.  When he returned to this country  earlier in the week prior to admission, he was seen at the [place]  office and a stress test was recommended.  This was markedly positive  with prolonged chest pain following the study with 2 mm ST depression in  II, III, aVF, and V3 through V6 that did not resolve until 10 minutes  after recovery.  The Myoview images showed a large basilar apical,  anterior, and anteroseptal reversible defect with overall ejection  fraction of 66%.  Because of this significant findings on stress test,  he was admitted this hospitalization for cardiac catheterization.   PAST MEDICAL HISTORY:  The patient has traveled extensively in [place] and [place] and he knows that he no longer use malaria  prophylaxis, but has no history of malaria in the past.   He has had no previous surgeries.  He denies history of hypertension.  He does have a history of hyperlipidemia.  He denies definite diabetes,  but while living in [place], he was evaluated for diabetes and told that  he was prediabetic and was given metformin to take when his glucose was  over 120.   FAMILY  HISTORY:  Positive for coronary artery disease.   SOCIAL HISTORY:  He is a nonsmoker.  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   MEDICATIONS PRIOR TO ADMISSION:  1. Lipitor 20 mg daily.  2. Multivitamin 1 daily.  3. Aspirin 81 mg daily.  4. Metformin 500 mg one-half tablet p.r.n.  5. Theraproxen p.r.n.  6. He also takes multiple daily vitamins including vitamin C, D, E as      well as fish oil.  7. Prevacid p.r.n.   ALLERGIES:  No known drug allergies.   HOSPITAL COURSE:  The patient was admitted and underwent cardiac  catheterization by Dr. Clifton James.  This revealed a 40% distal left main  stenosis with total occlusion of the LAD, but collaterals filling from  the right.  The circumflex is a relatively small vessel as is the ramus  without significant disease.  The right coronary artery was a large  dominant vessel with luminal regularities; however, at the very ostium,  there were several injections somewhat concerned that this is just  artifact of catheterization or whether the patient has an ostial right  coronary artery lesion.  Ejection fraction was 65%.  He had moderate MR;  however, the catheter was entangled in the mitral valve apparatus and  was felt possibly be artifactual.  Due to these findings, surgical  consultation was then obtained with Sheliah Plane, MD who evaluate the  patient and studies and agreed with the recommendation to proceed with  surgical revascularization.   PROCEDURE:  On February 01, 2009, he was taken to the operating room where  he underwent coronary artery bypass graft x2, following grafts were  placed:  1. Left internal mammary artery to the left anterior descending.  2. Saphenous vein graft to diagonal.  Notable findings during the      procedure were that the vessels were quite small.  In addition, the      left anterior descending was deeply intramyocardial.  The patient      tolerated the procedure well and  was taken to the surgical      intensive care unit in a stable condition.   POSTOPERATIVE HOSPITAL COURSE:  The patient has overall progressed  nicely.  He was weaned for the ventilator without significant  difficulty.  He has no significant postoperative bleeding.  All routine  lines, monitors, drainage devices, and  inotropic support were weaned in  a routine manner.  Initial EKG showed some diffuse ST elevation  consistent with pericarditis and CK-MBs were unremarkable.  The patient  does have an acute blood loss anemia.  These values has stabilized.  Most recent hematocrit dated February 04, 2009, is 29.7.  The patient has  had some moderate volume overload, but he is responding well to  diuretics.  Incisions are all healing well without evidence of  infection.  He is tolerating gradual increase in activity using standard  postcardiac surgery protocols.  He has required aggressive pulmonary  toilet.  Oxygen has been weaned and he maintains adequate saturations on  room air.  The patient recently has had a mild low-grade temperature.  There is no current evidence of infection.  This will be monitored  closely and white blood cell count will be checked prior to discharge.  Further evaluation may be warranted if he continues to have these  fevers.  The patient has had some sinus tachycardia and premature  ventricular contractions.  Beta-blocker is being titrated.  Currently,  he is tentatively stable for discharge in the next day or so.  Pending  morning around reevaluation.   INSTRUCTIONS:  The patient will receive written instructions in regard  to medications, activity, diet, wound care, and followup.   FOLLOWUP:  Include Dr. Tyrone Sage on March 01, 2009, at 1 p.m.  Additionally, he is instructed to contact Dr. Alford Highland office for a 2  week appointment with cardiology.   MEDICATIONS ON DISCHARGE:  1. Multivitamin daily.  2. Aspirin 325 mg daily.  3. Metformin 250 mg p.r.n. as  previously used for blood sugars greater      than 120.  4. Theraproxen 250 mg p.r.n. as used previously.  5. Crestor 40 mg daily.  6. Lipitor has been discontinued at this time.  7. Lopressor 50 mg twice daily.  8. Oxycodone 5 mg 1-2 every 4-6 h. p.r.n. as needed for pain.  9. Lasix 20 mg daily for 5 days.  10.Potassium chloride 10 mEq daily for 5 days.   FINAL DIAGNOSIS:  Severe coronary artery disease as described now status  post coronary artery bypass grafting x2.   OTHER DIAGNOSES:  1. Postoperative acute blood loss anemia.  2. Postoperative volume overload.  3. History of prediabetes.  4. History of positive family history of coronary artery disease.  5. History of hyperlipidemia.  6. History of significant overseas travel.  7. History of gastroesophageal reflux.      Rowe Clack, P.A.-C.      Sheliah Plane, MD  Electronically Signed    WEG/MEDQ  D:  02/05/2009  T:  02/06/2009  Job:  [id number]   cc:   Marca Ancona, MD  Dell Ponto, MD

## 2010-12-27 NOTE — Assessment & Plan Note (Signed)
OFFICE VISIT   [name], [name]  DOB:  03-19-1956                                        March 01, 2009  CHART #:  [id number]   HISTORY OF PRESENT ILLNESS:  The patient is status post coronary artery  bypass grafting x2 done by Dr. Tyrone Sage, February 01, 2009.  The patient's  postoperative course was pretty much unremarkable and he was discharged  to home in stable condition postop day #5.  The patient presents today  for his 3-week followup visit.  The patient states he did see Dr. Shirlee Latch  last week who placed him on lisinopril.  He states Dr. Shirlee Latch felt that  the patient was doing extremely well and plans to follow up with him in  3 months.  The patient has been contacted by cardiac rehab, told to  contact him following evaluations by Dr. Shirlee Latch in our office.  The  patient does plan to proceed with cardiac rehab once scheduled.  He is  up ambulating without difficulty.  He denies any shortness of breath.  He does complain of some mild sternal discomfort and occasional dull  pains around his left scapula but otherwise was progressing well.  His  diet is slowly coming back.  He is sleeping well at night.  The patient  is questioning, whether he is a true diabetic or not.  He does take the  metformin.   PHYSICAL EXAMINATION:  Vital Signs:  Blood pressure 102/70, pulse 80,  respirations of 16, O2 sats 99% on room air.  Respiratory:  Clear to  auscultation bilaterally.  Cardiac:  Regular rate and rhythm.  No  murmurs, gallops, or rubs noted.  Sternum is stable.  Abdomen:  Benign.  Extremities:  No edema noted.  Incisions:  All incisions are clean, dry,  and intact and healing well.   STUDIES:  The patient had PA and lateral chest x-ray done today, which  showed resolution of bilateral pleural effusions.  Wires appeared to be  intact.  There is no atelectasis noted.  A stable chest x-ray.   IMPRESSION AND PLAN:  The patient is progressing well status post CABG  done by Dr. Tyrone Sage.  He is told to contact his primary care physician  regarding his questionable diabetes.  Hemoglobin A1c done in the  hospital was 5.9 and currently the patient is taking metformin 500 mg.  He was told to continue this still follow up with his primary care  physician.  He is told to continue ambulating as tolerated and  encouraged to proceed with cardiac rehab.  He is continue using his  incentive spirometer.  The patient told he is released to drive short  distances and progress as tolerated.  He is told to do no heavy lifting  over 10 pounds for another month.  The patient is to follow up with Dr.  Shirlee Latch in 3 months and to continue his current medication regimen.  At  this time, we will release the patient from the office.  He was told if  he has any surgical issues, he is to contact us.  The patient is in  agreement.   Sheliah Plane, MD  Electronically Signed   KMD/MEDQ  D:  03/01/2009  T:  03/02/2009  Job:  [id number]   cc:   Sheliah Plane, MD  Marca Ancona, MD

## 2011-03-09 ENCOUNTER — Other Ambulatory Visit: Payer: Self-pay | Admitting: Cardiology

## 2011-05-02 ENCOUNTER — Telehealth: Payer: Self-pay | Admitting: Cardiology

## 2011-05-02 ENCOUNTER — Other Ambulatory Visit: Payer: Self-pay | Admitting: *Deleted

## 2011-05-02 DIAGNOSIS — I25810 Atherosclerosis of coronary artery bypass graft(s) without angina pectoris: Secondary | ICD-10-CM

## 2011-05-02 DIAGNOSIS — E785 Hyperlipidemia, unspecified: Secondary | ICD-10-CM

## 2011-05-02 NOTE — Telephone Encounter (Signed)
Pt needs refill on lipitor 80mg  qd called into the company. Please call him and let him know when this is done.

## 2011-05-02 NOTE — Telephone Encounter (Signed)
I reordered Lipitor 80mg  daily through [employer] to [facility] [phone] order # [id number] pt ID # [id number]. Pt is aware I have reordered.

## 2011-05-02 NOTE — Telephone Encounter (Signed)
I called Armed forces technical officer to [facility] [phone].

## 2011-05-08 ENCOUNTER — Encounter: Payer: Self-pay | Admitting: Cardiology

## 2011-05-08 ENCOUNTER — Telehealth: Payer: Self-pay | Admitting: Cardiology

## 2011-05-09 ENCOUNTER — Ambulatory Visit (INDEPENDENT_AMBULATORY_CARE_PROVIDER_SITE_OTHER): Payer: PRIVATE HEALTH INSURANCE | Admitting: Cardiology

## 2011-05-09 ENCOUNTER — Other Ambulatory Visit: Payer: PRIVATE HEALTH INSURANCE | Admitting: *Deleted

## 2011-05-09 ENCOUNTER — Encounter: Payer: Self-pay | Admitting: Cardiology

## 2011-05-09 VITALS — BP 130/74 | HR 60

## 2011-05-09 DIAGNOSIS — E785 Hyperlipidemia, unspecified: Secondary | ICD-10-CM

## 2011-05-09 DIAGNOSIS — I25810 Atherosclerosis of coronary artery bypass graft(s) without angina pectoris: Secondary | ICD-10-CM

## 2011-05-09 LAB — LIPID PANEL: Total CHOL/HDL Ratio: 3

## 2011-05-09 LAB — BASIC METABOLIC PANEL
CO2: 29 mEq/L (ref 19–32)
Calcium: 9.5 mg/dL (ref 8.4–10.5)
Creatinine, Ser: 1.1 mg/dL (ref 0.4–1.5)
Glucose, Bld: 104 mg/dL — ABNORMAL HIGH (ref 70–99)

## 2011-05-09 MED ORDER — NITROGLYCERIN 0.4 MG SL SUBL: 0.4000 mg | SUBLINGUAL_TABLET | SUBLINGUAL | Status: DC | PRN

## 2011-05-09 MED ORDER — LISINOPRIL 2.5 MG PO TABS: 2.5000 mg | ORAL_TABLET | Freq: Every day | ORAL | Status: DC

## 2011-05-09 NOTE — Patient Instructions (Signed)
Your physician recommends that you have a fasting lipid profile/BMP today  414.05   Your physician wants you to follow-up in: 6 months with Dr Shirlee Latch.(March 2013.You will receive a reminder letter in the mail two months in advance. If you don't receive a letter, please call our office to schedule the follow-up appointment.

## 2011-05-09 NOTE — Assessment & Plan Note (Signed)
Check lipids.  Goal LDL < 70.  He has switched from prescription Niaspan to over the counter niacin due to less side effects.

## 2011-05-09 NOTE — Assessment & Plan Note (Signed)
Doing well.  No ischemic symptoms.  Continue ASA, atovastatin 80, metoprolol, and lisinopril at current doses.

## 2011-05-09 NOTE — Progress Notes (Signed)
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  In general, he has done quite well since his operation.  He is now walking 30-60 minutes most days on the treadmill.  No exertional chest pain or shortness of breath.  He has not had to use nitroglycerin.  He is not taking metformin now.  He is planning on a missionary trip to [place] that will likely last for 3 months from 10/12 to 1/13.    Labs (7/10): LDL 61, HDL 31 Labs (1/11): TSH normal, K 4.6, creatinine 0.9, LDL 60, HDL 34, TG 65 Labs (3/12): K 5, creatinine 0.8, LFTs normal , LDL 63, HDL 39  ECG: NSR, normal  Allergies:  No Known Drug Allergies  Past Medical History: 1.  CAD:  ETT-myoview 01/27/09: large basal to apical anterior and anteroseptal reversible defect.  ECG also positive for ischemia.  LHC (6/10) showed EF 65%, totally occluded LAD with collaterals from RCA, 30-40% proximal CFX stenosis.  Patient had CABG by Dr. Tyrone Sage: LIMA-LAD, SVG-D2.  ETT (3/12): 7'51", borderline 1 mm ST depression in V3/V4 at peak exercise with immediate resolution with rest, no chest pain, overall thought to be low risk.  2.  Hyperlipidemia 3.  Type II diabetes: diet-controlled 4.  GERD 5.  Echo (6/10): EF 60-65%, No AS/AI, no MR, normal RV.   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 Originally from [place] in [place].  Married with 1 son. Nonsmoker, no ETOH.   Current Outpatient Prescriptions  Medication Sig Dispense Refill  . Ascorbic Acid (VITAMIN C) 1000 MG tablet Take 1,000 mg by mouth daily.        Marland Kitchen aspirin 325 MG tablet Take 325 mg by mouth daily.        Marland Kitchen atorvastatin (LIPITOR) 80 MG tablet Take 80 mg by mouth daily.        . cholecalciferol (VITAMIN D) 1000 UNITS tablet Take 1,000 Units by mouth daily.        Marland Kitchen FREESTYLE LITE  test strip USE TO TEST BLOOD SUGARS TWICE A DAY  200 each  2  . lansoprazole (PREVACID) 30 MG capsule Take 30 mg by mouth. As needed       . lisinopril (PRINIVIL,ZESTRIL) 2.5 MG tablet Take 1 tablet (2.5 mg total) by mouth daily.  30 tablet  6  . metformin (FORTAMET) 500 MG (OSM) 24 hr tablet Take 500 mg by mouth daily with breakfast.        . metoprolol tartrate (LOPRESSOR) 25 MG tablet TAKE ONE-HALF TABLET BY MOUTH TWICE DAILY  45 tablet  3  . Multiple Vitamin (MULTIVITAMINS PO) Take by mouth.        . niacin 500 MG tablet Two tablets in the evening       . nitroGLYCERIN (NITROSTAT) 0.4 MG SL tablet Place 1 tablet (0.4 mg total) under the tongue every 5 (five) minutes as needed.  90 tablet  3  . DISCONTD: nitroGLYCERIN (NITROSTAT) 0.4 MG SL tablet Place 0.4 mg under the tongue every 5 (five) minutes as needed.          BP 130/74  Pulse 60 General: NAD Neck: No JVD, no thyromegaly or thyroid nodule.  Lungs: Clear to auscultation bilaterally with normal respiratory effort. CV: Nondisplaced PMI.  Heart regular S1/S2, no S3/S4,  no murmur.  No peripheral edema.  No carotid bruit.  Normal pedal pulses.  Abdomen: Soft, nontender, no hepatosplenomegaly, no distention.  Skin: Intact without lesions or rashes.  Neurologic: Alert and oriented x 3.  Psych: Normal affect. Extremities: No clubbing or cyanosis.  HEENT: Normal.

## 2011-05-10 ENCOUNTER — Other Ambulatory Visit: Payer: PRIVATE HEALTH INSURANCE | Admitting: *Deleted

## 2011-09-14 ENCOUNTER — Other Ambulatory Visit: Payer: Self-pay | Admitting: Cardiology

## 2011-09-14 NOTE — Telephone Encounter (Signed)
Refill   Please call patient at hm# as this medications should go through a specified program he is in, medication should not be filled through pharmacy.  Patient will wait for return call

## 2011-09-15 NOTE — Telephone Encounter (Signed)
I reordered Lipitor 80mg  #90 for pt. I talked with Jasmine December-- order number [id number] [phone].  Pt ID # [id number]. Pt is aware I have placed reorder and it will be in in 7-10 days. This is the last reorder for pt. Pt  needs to fill out a new application and pt is aware of this. I will place new application with stock bottle for pt to pick up when the reorder arrives from [employer].

## 2011-09-20 ENCOUNTER — Encounter: Payer: Self-pay | Admitting: Cardiology

## 2011-09-20 ENCOUNTER — Ambulatory Visit (INDEPENDENT_AMBULATORY_CARE_PROVIDER_SITE_OTHER): Payer: PRIVATE HEALTH INSURANCE | Admitting: Cardiology

## 2011-09-20 DIAGNOSIS — E785 Hyperlipidemia, unspecified: Secondary | ICD-10-CM

## 2011-09-20 DIAGNOSIS — I25810 Atherosclerosis of coronary artery bypass graft(s) without angina pectoris: Secondary | ICD-10-CM

## 2011-09-20 NOTE — Assessment & Plan Note (Signed)
Lipids at goal (LDL < 65) in 1/13.

## 2011-09-20 NOTE — Patient Instructions (Signed)
Your physician wants you to follow-up in: 6 months with Dr McLean. (August 2013).You will receive a reminder letter in the mail two months in advance. If you don't receive a letter, please call our office to schedule the follow-up appointment.  

## 2011-09-20 NOTE — Assessment & Plan Note (Signed)
Doing well with no ischemic symptoms.  He had a negative stress test earlier this month.  I think that his symptoms in [place] were likely GERD (resolved with Prevacid, not affected by NTG).  Continue ASA, statin, lisinopril, metoprolol.

## 2011-09-20 NOTE — Progress Notes (Signed)
PCP: Dr. [name]  56 yo with h/o hyperlipidemia and CAD s/p CABG returns for evaluation.  In general, he has done quite well since his operation.  He is now walking 30-60 minutes most days on the treadmill.  No exertional chest pain or shortness of breath.  He took a recent trip to [place] with no problems while out of the country.  However, after he got back to [place], he had 3 pieces of pizza for dinner then exercised on the hotel treadmill without problem.  He woke up that night with burning in his chest that was not responsive to nitroglycerin.  The pain actually resolved when he took a Prevacid.  He went to the ER at [facility] in [place] where he was ruled out for MI and had a nuclear stress test that was normal.  Since then, no problems.  He is going to be going on a mission trip to [place] soon.    Labs (7/10): LDL 61, HDL 31 Labs (1/11): TSH normal, K 4.6, creatinine 0.9, LDL 60, HDL 34, TG 65 Labs (3/12): K 5, creatinine 0.8, LFTs normal , LDL 63, HDL 39 Labs (9/12): LDL 75, HDL 89 Labs (1/13): K 4.4, creatinine 0.88, LDL 65, HDL 47, TSH normal  Allergies:  No Known Drug Allergies  Past Medical History: 1.  CAD:  ETT-myoview 01/27/09: large basal to apical anterior and anteroseptal reversible defect.  ECG also positive for ischemia.  LHC (6/10) showed EF 65%, totally occluded LAD with collaterals from RCA, 30-40% proximal CFX stenosis.  Patient had CABG by Dr. Tyrone Sage: LIMA-LAD, SVG-D2.  ETT (3/12): 7'51", borderline 1 mm ST depression in V3/V4 at peak exercise with immediate resolution with rest, no chest pain, overall thought to be low risk.  Stress myoview (1/13) at [facility] was normal.  2.  Hyperlipidemia 3.  Type II diabetes: diet-controlled 4.  GERD 5.  Echo (6/10): EF 60-65%, No AS/AI, no MR, normal RV.   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 Originally from [place] in [place].  Married with 1 son. Nonsmoker, no ETOH.   Current Outpatient Prescriptions  Medication Sig Dispense Refill  . Ascorbic Acid (VITAMIN C) 1000 MG tablet Take 1,000 mg by mouth daily.        Marland Kitchen aspirin 325 MG tablet Take 325 mg by mouth daily.        Marland Kitchen atorvastatin (LIPITOR) 80 MG tablet Take 1 tablet (80 mg total) by mouth daily.    0  . cholecalciferol (VITAMIN D) 1000 UNITS tablet Take 1,000 Units by mouth daily.        Marland Kitchen FREESTYLE LITE test strip USE TO TEST BLOOD SUGARS TWICE A DAY  200 each  2  . lansoprazole (PREVACID) 30 MG capsule Take 30 mg by mouth. As needed       . lisinopril (PRINIVIL,ZESTRIL) 2.5 MG tablet Take 1 tablet (2.5 mg total) by mouth daily.  30 tablet  6  . metformin (FORTAMET) 500 MG (OSM) 24 hr tablet Take 500 mg by mouth as needed.       . metoprolol tartrate (LOPRESSOR) 25 MG tablet TAKE ONE-HALF TABLET BY MOUTH TWICE DAILY  45 tablet  3  . Multiple Vitamin (MULTIVITAMINS PO) Take by mouth.        . niacin 500 MG  tablet Two tablets in the evening       . nitroGLYCERIN (NITROSTAT) 0.4 MG SL tablet Place 1 tablet (0.4 mg total) under the tongue every 5 (five) minutes as needed.  90 tablet  3    BP 132/85  Pulse 69  Ht 5\' 5"  (1.651 m)  Wt 75.352 kg (166 lb 1.9 oz)  BMI 27.64 kg/m2 General: NAD Neck: No JVD, no thyromegaly or thyroid nodule.  Lungs: Clear to auscultation bilaterally with normal respiratory effort. CV: Nondisplaced PMI.  Heart regular S1/S2, no S3/S4, no murmur.  No peripheral edema.  No carotid bruit.  Normal pedal pulses.  Abdomen: Soft, nontender, no hepatosplenomegaly, no distention.  Neurologic: Alert and oriented x 3.  Psych: Normal affect. Extremities: No clubbing or cyanosis.

## 2011-09-21 ENCOUNTER — Telehealth: Payer: Self-pay | Admitting: *Deleted

## 2011-09-21 NOTE — Telephone Encounter (Signed)
Received Lipitor 80mg  #90 from [employer] and placed at front desk for pt to pick-up. LMTCB for pt to let him know medication was here.

## 2011-09-25 NOTE — Telephone Encounter (Signed)
Pt picked this up 09/22/11.

## 2011-10-06 ENCOUNTER — Ambulatory Visit: Payer: PRIVATE HEALTH INSURANCE | Admitting: Cardiology

## 2011-11-15 ENCOUNTER — Ambulatory Visit: Payer: PRIVATE HEALTH INSURANCE | Admitting: Cardiology

## 2011-11-15 NOTE — Telephone Encounter (Signed)
Close  

## 2012-02-23 ENCOUNTER — Ambulatory Visit (INDEPENDENT_AMBULATORY_CARE_PROVIDER_SITE_OTHER): Payer: PRIVATE HEALTH INSURANCE | Admitting: Cardiology

## 2012-02-23 ENCOUNTER — Encounter: Payer: Self-pay | Admitting: Cardiology

## 2012-02-23 VITALS — BP 116/72 | HR 56 | Ht 65.0 in | Wt 165.0 lb

## 2012-02-23 DIAGNOSIS — E785 Hyperlipidemia, unspecified: Secondary | ICD-10-CM

## 2012-02-23 DIAGNOSIS — I25810 Atherosclerosis of coronary artery bypass graft(s) without angina pectoris: Secondary | ICD-10-CM

## 2012-02-23 NOTE — Assessment & Plan Note (Signed)
Check lipids, goal LDL < 70.  

## 2012-02-23 NOTE — Progress Notes (Signed)
Patient ID: [name], male   DOB: [birth year], 56 y.o.   MRN: [id number] PCP: Dr. [name]  56 yo with h/o hyperlipidemia and CAD s/p CABG returns for evaluation.  In general, he has done quite well since his operation.  He is now walking 30-60 minutes most days on the treadmill.  No exertional chest pain or shortness of breath.   He went to [place] recently with no problems.     Labs (7/10): LDL 61, HDL 31 Labs (1/11): TSH normal, K 4.6, creatinine 0.9, LDL 60, HDL 34, TG 65 Labs (3/12): K 5, creatinine 0.8, LFTs normal , LDL 63, HDL 39 Labs (9/12): LDL 75, HDL 89 Labs (1/13): K 4.4, creatinine 0.88, LDL 65, HDL 47, TSH normal  ECG: NSR, lateral T wave inversions  Allergies:  No Known Drug Allergies  Past Medical History: 1.  CAD:  ETT-myoview 01/27/09: large basal to apical anterior and anteroseptal reversible defect.  ECG also positive for ischemia.  LHC (6/10) showed EF 65%, totally occluded LAD with collaterals from RCA, 30-40% proximal CFX stenosis.  Patient had CABG by Dr. Tyrone Sage: LIMA-LAD, SVG-D2.  ETT (3/12): 7'51", borderline 1 mm ST depression in V3/V4 at peak exercise with immediate resolution with rest, no chest pain, overall thought to be low risk.  Stress myoview (1/13) at [facility] was normal.  2.  Hyperlipidemia 3.  Type II diabetes: diet-controlled 4.  GERD 5.  Echo (6/10): EF 60-65%, No AS/AI, no MR, normal RV.   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 Originally from [place] in [place].  Married with 1 son. Nonsmoker, no ETOH.   Current Outpatient Prescriptions  Medication Sig Dispense Refill  . ALPHA LIPOIC ACID PO Take 200 mg by mouth daily.      . Ascorbic Acid (VITAMIN C) 1000 MG tablet Take 1,000 mg by mouth daily.        Marland Kitchen aspirin 325 MG tablet Take 325 mg by mouth daily.        Marland Kitchen  atorvastatin (LIPITOR) 80 MG tablet Take 1 tablet (80 mg total) by mouth daily.    0  . cholecalciferol (VITAMIN D) 1000 UNITS tablet Take 1,000 Units by mouth daily.        . Coenzyme Q10 (CO Q-10) 300 MG CAPS Take by mouth daily.      Marland Kitchen FREESTYLE LITE test strip USE TO TEST BLOOD SUGARS TWICE A DAY  200 each  2  . lansoprazole (PREVACID) 30 MG capsule Take 30 mg by mouth. As needed       . lisinopril (PRINIVIL,ZESTRIL) 2.5 MG tablet Take 1 tablet (2.5 mg total) by mouth daily.  30 tablet  6  . metformin (FORTAMET) 500 MG (OSM) 24 hr tablet Take 500 mg by mouth as needed.       . metoprolol tartrate (LOPRESSOR) 25 MG tablet TAKE ONE-HALF TABLET BY MOUTH TWICE DAILY  45 tablet  3  . Multiple Vitamin (MULTIVITAMINS PO) Take by mouth.        . niacin 500 MG tablet Two tablets in the evening       . nitroGLYCERIN (NITROSTAT) 0.4 MG SL tablet Place 1 tablet (0.4 mg total) under the tongue every 5 (five) minutes as needed.  90 tablet  3    BP 116/72  Pulse 56  Ht 5'  5" (1.651 m)  Wt 165 lb (74.844 kg)  BMI 27.46 kg/m2 General: NAD Neck: No JVD, no thyromegaly or thyroid nodule.  Lungs: Clear to auscultation bilaterally with normal respiratory effort. CV: Nondisplaced PMI.  Heart regular S1/S2, no S3/S4, no murmur.  No peripheral edema.  No carotid bruit.  Normal pedal pulses.  Abdomen: Soft, nontender, no hepatosplenomegaly, no distention.  Neurologic: Alert and oriented x 3.  Psych: Normal affect. Extremities: No clubbing or cyanosis.

## 2012-02-23 NOTE — Assessment & Plan Note (Signed)
Doing well with no ischemic symptoms.  He had a negative stress test earlier this year. Continue ASA, statin, lisinopril, metoprolol.

## 2012-02-23 NOTE — Patient Instructions (Addendum)
Your physician recommends that you return for a FASTING lipid profile /BMET.  Your physician wants you to follow-up in: 6 months with Dr Shirlee Latch. (January 2014). You will receive a reminder letter in the mail two months in advance. If you don't receive a letter, please call our office to schedule the follow-up appointment.

## 2012-02-29 ENCOUNTER — Other Ambulatory Visit (INDEPENDENT_AMBULATORY_CARE_PROVIDER_SITE_OTHER): Payer: PRIVATE HEALTH INSURANCE

## 2012-02-29 DIAGNOSIS — E785 Hyperlipidemia, unspecified: Secondary | ICD-10-CM

## 2012-02-29 DIAGNOSIS — I25810 Atherosclerosis of coronary artery bypass graft(s) without angina pectoris: Secondary | ICD-10-CM

## 2012-02-29 LAB — BASIC METABOLIC PANEL
Calcium: 9 mg/dL (ref 8.4–10.5)
Creatinine, Ser: 0.9 mg/dL (ref 0.4–1.5)
Sodium: 138 mEq/L (ref 135–145)

## 2012-02-29 LAB — LIPID PANEL
Cholesterol: 123 mg/dL (ref 0–200)
Triglycerides: 64 mg/dL (ref 0.0–149.0)

## 2012-03-15 ENCOUNTER — Ambulatory Visit: Payer: PRIVATE HEALTH INSURANCE | Admitting: Cardiology

## 2012-04-01 ENCOUNTER — Telehealth: Payer: Self-pay | Admitting: Cardiology

## 2012-04-01 NOTE — Telephone Encounter (Signed)
New msg Pt wants to talk to you about pt assistance thru drug company for lipitor.

## 2012-04-01 NOTE — Telephone Encounter (Signed)
Spoke with pt. He is requesting another application for [employer] to [employer] pt assistance program for Lipitor. I have completed the physician portion of the application and mailed  the remaining application to him to complete.

## 2012-04-03 ENCOUNTER — Other Ambulatory Visit: Payer: Self-pay | Admitting: Cardiology

## 2012-04-09 ENCOUNTER — Other Ambulatory Visit: Payer: Self-pay | Admitting: Cardiology

## 2012-07-01 ENCOUNTER — Encounter: Payer: Self-pay | Admitting: Nurse Practitioner

## 2012-07-01 ENCOUNTER — Ambulatory Visit (INDEPENDENT_AMBULATORY_CARE_PROVIDER_SITE_OTHER): Payer: Self-pay | Admitting: Nurse Practitioner

## 2012-07-01 VITALS — BP 120/76 | HR 68 | Ht 65.0 in | Wt 167.8 lb

## 2012-07-01 DIAGNOSIS — R079 Chest pain, unspecified: Secondary | ICD-10-CM

## 2012-07-01 NOTE — Patient Instructions (Addendum)
Check the Lipitor website for any coupons  Keep exercising  Let us know if your symptoms come back, we will then need to do further testing  Stay on your current medicines  Ok to use your Coenzyme Q10  Let us know if we need to change your cholesterol medicine to something more affordable  Call us back with a time to come back to see Dr. Shirlee Latch  Call the [facility] office at [phone] if you have any questions, problems or concerns.

## 2012-07-01 NOTE — Progress Notes (Signed)
[name] Date of Birth: 24-Sep-1955 Medical Record #[id number]  History of Present Illness: [name] is seen back today for a work in visit. He is seen for Dr. Shirlee Latch. He has known CAD with prior CABG in 2010. Other issues include DM, HLD, and GERD. Negative stress myoview in January of this year done in [place] was normal.  He was here back in July and felt to be doing ok.   He comes back today. He is here alone. It is an early visit for him. Due to see Dr. Shirlee Latch in January but will be on a mission trip. He has lost his insurance. Not going to be able to afford Lipitor. Not using Prevacid as well. He continues to exercise about 5 times a week without any issue. About 3 weeks ago, he noted that he felt "uneasy" in his chest. Hard for him to describe. It would come and go with no aggravating or alleviating factors. No NTG used. It would last a few minutes. Not short of breath. Not like his prior heart pain at all. That was more of a throat and neck burning. He has no symptoms whatsoever on the treadmill. Now feels just fine with no problems reported.   Current Outpatient Prescriptions on File Prior to Visit  Medication Sig Dispense Refill  . ALPHA LIPOIC ACID PO Take 200 mg by mouth daily.      . Ascorbic Acid (VITAMIN C) 1000 MG tablet Take 1,000 mg by mouth daily.        Marland Kitchen aspirin 325 MG tablet Take 325 mg by mouth daily.        Marland Kitchen atorvastatin (LIPITOR) 80 MG tablet Take 1 tablet (80 mg total) by mouth daily.    0  . cholecalciferol (VITAMIN D) 1000 UNITS tablet Take 1,000 Units by mouth daily.        Marland Kitchen FREESTYLE LITE test strip USE TO TEST BLOOD SUGARS TWICE A DAY  200 each  0  . lisinopril (PRINIVIL,ZESTRIL) 2.5 MG tablet Take 1 tablet (2.5 mg total) by mouth daily.  30 tablet  6  . metoprolol tartrate (LOPRESSOR) 25 MG tablet TAKE ONE-HALF TABLET BY MOUTH TWICE DAILY  45 tablet  4  . Multiple Vitamin (MULTIVITAMINS PO) Take by mouth.        . niacin 500 MG tablet Two  tablets in the evening       . nitroGLYCERIN (NITROSTAT) 0.4 MG SL tablet Place 1 tablet (0.4 mg total) under the tongue every 5 (five) minutes as needed.  90 tablet  3  . lansoprazole (PREVACID) 30 MG capsule Take 30 mg by mouth. As needed       . metformin (FORTAMET) 500 MG (OSM) 24 hr tablet Take 500 mg by mouth as needed.         No Known Allergies  Past Medical History  Diagnosis Date  . Coronary artery disease     ETT-myoview 01/27/09: large basal to apical anterior and anteroseptal reversible defect.  ECG also positive  for ischemia.  LHC (6/10) showed EF 65% totally occluded LAD with collaterals from RCA, 30-40% proximal CFX stenosis  . Hyperlipidemia   . Diabetes mellitus   . GERD (gastroesophageal reflux disease)     Past Surgical History  Procedure Date  . Doppler echocardiography     (6/10): EF 60-65%, NO AS/AI, no MR, normal RV.    History  Smoking status  . Never Smoker   Smokeless tobacco  . Never  Used    History  Alcohol Use No    Family History  Problem Relation Age of Onset  . Heart attack Brother 48  . Stroke Mother 86  . Coronary artery disease      Review of Systems: The review of systems is per the HPI.  All other systems were reviewed and are negative.  Physical Exam: BP 120/76  Pulse 68  Ht 5\' 5"  (1.651 m)  Wt 167 lb 12.8 oz (76.114 kg)  BMI 27.92 kg/m2 Patient is very pleasant and in no acute distress. Skin is warm and dry. Color is normal.  HEENT is unremarkable. Normocephalic/atraumatic. PERRL. Sclera are nonicteric. Neck is supple. No masses. No JVD. Lungs are clear. Cardiac exam shows a regular rate and rhythm. Abdomen is soft. Extremities are without edema. Gait and ROM are intact. No gross neurologic deficits noted.   LABORATORY DATA: EKG today shows sinus rhythm. He has lateral T wave changes.     Chemistry      Component Value Date/Time   NA 138 02/29/2012 0917   K 4.0 02/29/2012 0917   CL 104 02/29/2012 0917   CO2 25 02/29/2012  0917   BUN 12 02/29/2012 0917   CREATININE 0.9 02/29/2012 0917      Component Value Date/Time   CALCIUM 9.0 02/29/2012 0917   ALKPHOS 56 10/18/2010 0930   AST 25 10/18/2010 0930   ALT 24 10/18/2010 0930   BILITOT 0.9 10/18/2010 0930         Lab Results  Component Value Date   CHOL 123 02/29/2012   HDL 40.90 02/29/2012   LDLCALC 69 02/29/2012   TRIG 64.0 02/29/2012   CHOLHDL 3 02/29/2012     Assessment / Plan: 1. CAD with atypical chest pain - I think he is doing well. No exertional symptoms reported or similar symptoms that led to his CABG in 2010. Negative Myoview in January of 2013. I have encouraged him to stay active. If his symptoms return, we will need to do further testing.   2. GERD - not using his Prevacid due to cost  3. HLD - still on Lipitor but cost may be prohibitive. He is going to search the website. He will call us if we need to change.   4. DM - uses metformin as needed. A1C was 6.1.   Patient is agreeable to this plan and will call if any problems develop in the interim.

## 2012-07-04 ENCOUNTER — Other Ambulatory Visit: Payer: Self-pay | Admitting: Cardiology

## 2012-07-05 ENCOUNTER — Other Ambulatory Visit: Payer: Self-pay | Admitting: *Deleted

## 2012-07-05 MED ORDER — ATORVASTATIN CALCIUM 80 MG PO TABS: 80.0000 mg | ORAL_TABLET | Freq: Every day | ORAL | Status: DC

## 2012-09-28 ENCOUNTER — Other Ambulatory Visit: Payer: Self-pay

## 2012-11-04 ENCOUNTER — Ambulatory Visit (INDEPENDENT_AMBULATORY_CARE_PROVIDER_SITE_OTHER): Payer: Self-pay | Admitting: Cardiology

## 2012-11-04 ENCOUNTER — Encounter: Payer: Self-pay | Admitting: Cardiology

## 2012-11-04 ENCOUNTER — Ambulatory Visit: Payer: Self-pay | Admitting: Cardiology

## 2012-11-04 VITALS — BP 122/72 | HR 69 | Ht 65.0 in | Wt 168.0 lb

## 2012-11-04 DIAGNOSIS — E785 Hyperlipidemia, unspecified: Secondary | ICD-10-CM

## 2012-11-04 DIAGNOSIS — I25810 Atherosclerosis of coronary artery bypass graft(s) without angina pectoris: Secondary | ICD-10-CM

## 2012-11-04 MED ORDER — LISINOPRIL 5 MG PO TABS: 5.0000 mg | ORAL_TABLET | Freq: Every day | ORAL | Status: DC

## 2012-11-04 MED ORDER — LISINOPRIL 2.5 MG PO TABS: 2.5000 mg | ORAL_TABLET | Freq: Two times a day (BID) | ORAL | Status: DC

## 2012-11-04 NOTE — Patient Instructions (Addendum)
Increase lisinopril to 2.5mg  two times a day.   Your physician recommends that you return for a FASTING lipid profile /BMET in 1 week.   Your physician wants you to follow-up in: 6 months with Dr Shirlee Latch. (September 2014). You will receive a reminder letter in the mail two months in advance. If you don't receive a letter, please call our office to schedule the follow-up appointment.

## 2012-11-04 NOTE — Progress Notes (Signed)
Patient ID: [name], male   DOB: 17-Oct-1955, 57 y.o.   MRN: [id number] PCP: Dr. [name]  57 yo with h/o hyperlipidemia and CAD s/p CABG returns for evaluation.  In general, he has done quite well since his operation.  He is now walking 30-60 minutes most days on the treadmill.  No exertional chest pain or shortness of breath.   He still gets occasional reflux symptoms.  He was in [place] over the [place] ([place] with no problems.  He will be going to the [place] in 5/14.    Labs (7/10): LDL 61, HDL 31 Labs (1/11): TSH normal, K 4.6, creatinine 0.9, LDL 60, HDL 34, TG 65 Labs (3/12): K 5, creatinine 0.8, LFTs normal , LDL 63, HDL 39 Labs (9/12): LDL 75, HDL 89 Labs (1/13): K 4.4, creatinine 0.88, LDL 65, HDL 47, TSH normal Labs (7/13): LDL 69, HDL 41  Allergies:  No Known Drug Allergies  Past Medical History: 1.  CAD:  ETT-myoview 01/27/09: large basal to apical anterior and anteroseptal reversible defect.  ECG also positive for ischemia.  LHC (6/10) showed EF 65%, totally occluded LAD with collaterals from RCA, 30-40% proximal CFX stenosis.  Patient had CABG by Dr. Tyrone Sage: LIMA-LAD, SVG-D2.  ETT (3/12): 7'51", borderline 1 mm ST depression in V3/V4 at peak exercise with immediate resolution with rest, no chest pain, overall thought to be low risk.  Stress myoview (1/13) at [facility] was normal.  2.  Hyperlipidemia 3.  Type II diabetes: diet-controlled 4.  GERD 5.  Echo (6/10): EF 60-65%, No AS/AI, no MR, normal RV.   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 Originally from [place] in [place].  Married with 1 son. Nonsmoker, no ETOH.   Current Outpatient Prescriptions  Medication Sig Dispense Refill  . ALPHA LIPOIC ACID PO Take 200 mg by mouth daily.      . Ascorbic Acid (VITAMIN C) 1000  MG tablet Take 1,000 mg by mouth daily.        Marland Kitchen aspirin 325 MG tablet Take 325 mg by mouth daily.        Marland Kitchen atorvastatin (LIPITOR) 80 MG tablet Take 1 tablet (80 mg total) by mouth daily.  30 tablet  12  . cholecalciferol (VITAMIN D) 1000 UNITS tablet Take 1,000 Units by mouth daily.        . Coenzyme Q10 (CO Q 10 PO) Take 1 capsule by mouth daily.      Marland Kitchen FREESTYLE LITE test strip USE TO TEST BLOOD SUGARS TWICE A DAY  200 each  0  . lansoprazole (PREVACID) 30 MG capsule Take 30 mg by mouth. As needed       . metformin (FORTAMET) 500 MG (OSM) 24 hr tablet Take 500 mg by mouth as needed.       . metoprolol tartrate (LOPRESSOR) 25 MG tablet TAKE ONE-HALF TABLET BY MOUTH TWICE DAILY  45 tablet  4  . Multiple Vitamin (MULTIVITAMINS PO) Take by mouth.        . niacin 500 MG tablet Two tablets in the evening       . nitroGLYCERIN (NITROSTAT) 0.4 MG SL tablet Place 1 tablet (0.4 mg total) under the tongue every 5 (five) minutes as needed.  90 tablet  3  . lisinopril (PRINIVIL,ZESTRIL) 2.5 MG tablet Take 1 tablet (  2.5 mg total) by mouth 2 (two) times daily.  180 tablet  3   No current facility-administered medications for this visit.   BP 122/72  Pulse 69  Ht 5\' 5"  (1.651 m)  Wt 168 lb (76.204 kg)  BMI 27.96 kg/m2  SpO2 98% General: NAD Neck: No JVD, no thyromegaly or thyroid nodule.  Lungs: Clear to auscultation bilaterally with normal respiratory effort. CV: Nondisplaced PMI.  Heart regular S1/S2, no S3/S4, no murmur.  No peripheral edema.  No carotid bruit.  Normal pedal pulses.  Abdomen: Soft, nontender, no hepatosplenomegaly, no distention.  Neurologic: Alert and oriented x 3.  Psych: Normal affect. Extremities: No clubbing or cyanosis.  Assessment/Plan: 1. CAD: Stable status post CABG.  Good exercise tolerance.  Continue ASA, atorvastatin, metoprolol.  I will increase lisinopril to 5 mg daily.  BMET in 1 week.  2. Hyperlipidemia: Will arrange to check fasting lipids.  Continue  atorvastatin and Niacin for now.   Marca Ancona 11/04/2012

## 2012-11-15 ENCOUNTER — Ambulatory Visit: Payer: Self-pay | Admitting: Cardiology

## 2012-11-18 ENCOUNTER — Other Ambulatory Visit (INDEPENDENT_AMBULATORY_CARE_PROVIDER_SITE_OTHER): Payer: BC Managed Care – PPO

## 2012-11-18 DIAGNOSIS — I25810 Atherosclerosis of coronary artery bypass graft(s) without angina pectoris: Secondary | ICD-10-CM

## 2012-11-18 LAB — BASIC METABOLIC PANEL
BUN: 11 mg/dL (ref 6–23)
Chloride: 104 mEq/L (ref 96–112)
Glucose, Bld: 99 mg/dL (ref 70–99)
Potassium: 4 mEq/L (ref 3.5–5.1)
Sodium: 138 mEq/L (ref 135–145)

## 2012-11-18 LAB — LIPID PANEL
HDL: 38.1 mg/dL — ABNORMAL LOW (ref >39.00)
LDL Cholesterol: 73 mg/dL (ref 0–99)
VLDL: 11.2 mg/dL (ref 0.0–40.0)

## 2012-11-21 ENCOUNTER — Other Ambulatory Visit: Payer: Self-pay | Admitting: *Deleted

## 2012-12-06 ENCOUNTER — Encounter: Payer: Self-pay | Admitting: Cardiology

## 2012-12-16 ENCOUNTER — Other Ambulatory Visit: Payer: Self-pay | Admitting: Cardiology

## 2013-01-27 ENCOUNTER — Telehealth: Payer: Self-pay | Admitting: Neurology

## 2013-01-27 DIAGNOSIS — R0683 Snoring: Secondary | ICD-10-CM

## 2013-01-27 DIAGNOSIS — G4730 Sleep apnea, unspecified: Secondary | ICD-10-CM

## 2013-01-27 DIAGNOSIS — G4710 Hypersomnia, unspecified: Secondary | ICD-10-CM

## 2013-01-27 NOTE — Telephone Encounter (Signed)
. °  Dr. Martha Clan is referring [name], 57 y.o. y/o male, for the evaluation of sleep apnea.  Wt: 169.38  Ht: 65.50 BMI: 27.76  Diagnoses: EDS Witnessed Apneas Snoring Non-Restorative Sleep Daytime Fatigue Hypertension CAD - Heart bypass 4 years ago Hyperlipidemia IFG GERD  Medication List: Current Outpatient Prescriptions  Medication Sig Dispense Refill   ALPHA LIPOIC ACID PO Take 200 mg by mouth daily.       Ascorbic Acid (VITAMIN C) 1000 MG tablet Take 1,000 mg by mouth daily.         aspirin 325 MG tablet Take 325 mg by mouth daily.         atorvastatin (LIPITOR) 80 MG tablet Take 1 tablet (80 mg total) by mouth daily.  30 tablet  12   cholecalciferol (VITAMIN D) 1000 UNITS tablet Take 1,000 Units by mouth daily.         Coenzyme Q10 (CO Q 10 PO) Take 1 capsule by mouth daily.       lansoprazole (PREVACID) 30 MG capsule Take 30 mg by mouth. As needed        lisinopril (PRINIVIL,ZESTRIL) 2.5 MG tablet Take 1 tablet (2.5 mg total) by mouth 2 (two) times daily.  180 tablet  3   metformin (FORTAMET) 500 MG (OSM) 24 hr tablet Take 500 mg by mouth as needed.        metoprolol tartrate (LOPRESSOR) 25 MG tablet TAKE ONE-HALF TABLET BY MOUTH TWICE DAILY  45 tablet  4   Multiple Vitamin (MULTIVITAMINS PO) Take by mouth.         niacin 500 MG tablet Two tablets in the evening        nitroGLYCERIN (NITROSTAT) 0.4 MG SL tablet Place 1 tablet (0.4 mg total) under the tongue every 5 (five) minutes as needed.  90 tablet  3   FREESTYLE LITE test strip USE TO TEST BLOOD SUGARS TWICE A DAY  200 each  0   No current facility-administered medications for this visit.    This patient presents to Dr. [name] with wife reporting of witnessed apneic events.  Pt complains of excessive daytime sleepiness, endorsing ESS at 12.  Pt also reports loud snoring, daytime fatigue and non-restorative sleep.  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  He would  like to have the study done asap.  Insurance:  [employer] - Prior Approval is not required.  Study covered at 100%

## 2013-01-28 ENCOUNTER — Ambulatory Visit (INDEPENDENT_AMBULATORY_CARE_PROVIDER_SITE_OTHER): Payer: BC Managed Care – PPO

## 2013-01-28 ENCOUNTER — Telehealth: Payer: Self-pay | Admitting: Neurology

## 2013-01-28 DIAGNOSIS — G4730 Sleep apnea, unspecified: Secondary | ICD-10-CM

## 2013-01-28 DIAGNOSIS — G4737 Central sleep apnea in conditions classified elsewhere: Secondary | ICD-10-CM

## 2013-01-28 DIAGNOSIS — G4733 Obstructive sleep apnea (adult) (pediatric): Secondary | ICD-10-CM

## 2013-01-28 DIAGNOSIS — R0683 Snoring: Secondary | ICD-10-CM

## 2013-01-28 DIAGNOSIS — G4710 Hypersomnia, unspecified: Secondary | ICD-10-CM

## 2013-01-28 NOTE — Telephone Encounter (Signed)
Please give him an appointment ASAP.

## 2013-01-28 NOTE — Telephone Encounter (Signed)
Place for ASAP consult, we can likely do the SPLIt study the same night.

## 2013-02-04 ENCOUNTER — Other Ambulatory Visit: Payer: Self-pay | Admitting: Neurology

## 2013-02-04 ENCOUNTER — Ambulatory Visit (INDEPENDENT_AMBULATORY_CARE_PROVIDER_SITE_OTHER): Payer: BC Managed Care – PPO | Admitting: Nurse Practitioner

## 2013-02-04 ENCOUNTER — Encounter: Payer: Self-pay | Admitting: Nurse Practitioner

## 2013-02-04 VITALS — BP 130/80 | HR 62 | Ht 65.0 in | Wt 172.4 lb

## 2013-02-04 DIAGNOSIS — R002 Palpitations: Secondary | ICD-10-CM

## 2013-02-04 DIAGNOSIS — R079 Chest pain, unspecified: Secondary | ICD-10-CM

## 2013-02-04 DIAGNOSIS — G4733 Obstructive sleep apnea (adult) (pediatric): Secondary | ICD-10-CM

## 2013-02-04 NOTE — Patient Instructions (Addendum)
Stay on your current medicines  Exercise with a goal of 45 minutes per day  Try to work on your weight  We will see you back in November with Dr. Shirlee Latch  Call the [facility] office at [phone] if you have any questions, problems or concerns.

## 2013-02-04 NOTE — Progress Notes (Signed)
Antionette Poles Date of Birth: 04-May-1956 Medical Record #[id number]  History of Present Illness: Mr. Dennington is seen back today for a work in visit. Seen for Dr. Shirlee Latch. He has a history of CAD with past CABG in 2010, HLD, type 2 DM, and GERD. Echo in 2010 showed normal LV function. Negative Myoview in January of 2013.   Last seen here in March - was felt to be doing well. His ACE was increased at that time.   He comes in today. He is here alone. Overall he has been doing well since his last visit here. No chest pain. Not short of breath. Not exercising as much as he should be. Weight is up. He had two spells about 3 weeks ago where after he woke up, he felt his heart beat a little fast. Short lived. Not dizzy or lightheaded. No syncope. Only had 2 spells and none since. Has cut back his caffeine. 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 Dr. [name] monitors his labs.    Current Outpatient Prescriptions  Medication Sig Dispense Refill  . ALPHA LIPOIC ACID PO Take 200 mg by mouth daily.      . Ascorbic Acid (VITAMIN C) 1000 MG tablet Take 1,000 mg by mouth daily.        Marland Kitchen aspirin 325 MG tablet Take 325 mg by mouth daily.        Marland Kitchen atorvastatin (LIPITOR) 80 MG tablet Take 1 tablet (80 mg total) by mouth daily.  30 tablet  12  . cholecalciferol (VITAMIN D) 1000 UNITS tablet Take 1,000 Units by mouth daily.        . Coenzyme Q10 (CO Q 10 PO) Take 1 capsule by mouth daily.      Marland Kitchen FREESTYLE LITE test strip USE TO TEST BLOOD SUGARS TWICE A DAY  200 each  0  . lansoprazole (PREVACID) 30 MG capsule Take 30 mg by mouth. As needed       . lisinopril (PRINIVIL,ZESTRIL) 2.5 MG tablet Take 1 tablet (2.5 mg total) by mouth 2 (two) times daily.  180 tablet  3  . metformin (FORTAMET) 500 MG (OSM) 24 hr tablet Take 500 mg by mouth as needed.       . metoprolol tartrate (LOPRESSOR) 25 MG tablet TAKE ONE-HALF TABLET BY MOUTH TWICE DAILY  45 tablet  4  . Multiple Vitamin  (MULTIVITAMINS PO) Take by mouth.        . niacin 500 MG tablet Two tablets in the evening       . nitroGLYCERIN (NITROSTAT) 0.4 MG SL tablet Place 1 tablet (0.4 mg total) under the tongue every 5 (five) minutes as needed.  90 tablet  3   No current facility-administered medications for this visit.    No Known Allergies   Past Medical History:  1. CAD: ETT-myoview 01/27/09: large basal to apical anterior and anteroseptal reversible defect. ECG also positive for ischemia. LHC (6/10) showed EF 65%, totally occluded LAD with collaterals from RCA, 30-40% proximal CFX stenosis. Patient had CABG by Dr. Tyrone Sage: LIMA-LAD, SVG-D2. ETT (3/12): 7'51", borderline 1 mm ST depression in V3/V4 at peak exercise with immediate resolution with rest, no chest pain, overall thought to be low risk. Stress my[facility]iew (1/13) [facility] was normal.  2. Hyperlipidemia  3. Type II diabetes: diet-controlled  4. GERD  5. Echo (6/10): EF 60-65%, No AS/AI, no MR, normal RV.    Past  Surgical History  Procedure Laterality Date  . Doppler echocardiography      (6/10): EF 60-65%, NO AS/AI, no MR, normal RV.    History  Smoking status  . Never Smoker   Smokeless tobacco  . Never Used    History  Alcohol Use No    Family History  Problem Relation Age of Onset  . Heart attack Brother 48  . Stroke Mother 88  . Coronary artery disease      Review of Systems: The review of systems is per the HPI.  All other systems were reviewed and are negative.  Physical Exam: BP 130/80  Pulse 62  Ht 5\' 5"  (1.651 m)  Wt 172 lb 6.4 oz (78.2 kg)  BMI 28.69 kg/m2 Patient is very pleasant and in no acute distress. Skin is warm and dry. Color is normal.  HEENT is unremarkable. Normocephalic/atraumatic. PERRL. Sclera are nonicteric. Neck is supple. No masses. No JVD. Lungs are clear. Cardiac exam shows a regular rate and rhythm. Abdomen is soft. Extremities are without edema. Gait and ROM are intact.  No gross neurologic deficits noted.  LABORATORY DATA: EKG today shows sinus rhythm. T wave inversion in AVL - tracing is unchanged.     Chemistry      Component Value Date/Time   NA 138 11/18/2012 0820   K 4.0 11/18/2012 0820   CL 104 11/18/2012 0820   CO2 26 11/18/2012 0820   BUN 11 11/18/2012 0820   CREATININE 1.0 11/18/2012 0820      Component Value Date/Time   CALCIUM 9.0 11/18/2012 0820   ALKPHOS 56 10/18/2010 0930   AST 25 10/18/2010 0930   ALT 24 10/18/2010 0930   BILITOT 0.9 10/18/2010 0930     Lab Results  Component Value Date   CHOL 122 11/18/2012   HDL 38.10* 11/18/2012   LDLCALC 73 11/18/2012   TRIG 56.0 11/18/2012   CHOLHDL 3 11/18/2012   Lab Results  Component Value Date   WBC 7.4 02/06/2009   HGB 10.3* 02/06/2009   HCT 30.7* 02/06/2009   MCV 83.6 02/06/2009   PLT 311 02/06/2009    Assessment / Plan: 1. Palpitations - 2 short lived spells 3 weeks ago. Asymptomatic. Fine now. I do not think he needs further testing at this time. If recurs or becomes symptomatic - would consider heart monitor.   2. CAD - past CABG - no chest pain reported. Negative Myoview in January of 2103. Needs to focus on CV risk factor modification with diet/weight loss/exercise.   3. HLD - on statin  Will see him back in November as planned.   Patient is agreeable to this plan and will call if any problems develop in the interim.   Rosalio Macadamia, RN, ANP-C [facility] [address] [place], [place]  [address]

## 2013-02-12 ENCOUNTER — Telehealth: Payer: Self-pay | Admitting: *Deleted

## 2013-02-12 NOTE — Telephone Encounter (Signed)
Called patient to discuss sleep study results from 01/28/2013.  Discussed findings, recommendations and follow up care.  Patient understood well and all questions were answered.    He does have concern about CPAP therapy, mostly the size of the CPAP.  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  He stated the machine at the sleep lab was still bigger than he would have room for and there are times he would require a battery rather than electricity.  After much discussion, I felt it best to try and get him in to see Dr. Vickey Huger ASAP to discuss alternative therapies as oral appliance device or positional therapy.  There are no available appointments however before this patient leaves the country.  I explained I would give this info to Dr. Vickey Huger for her review and recommendations.  Patient states that if it is something where he has to see the dentist and be fit for the device, he may not have time to do that.  We discussed positional therapies, in his case, he slept for 85% of the night on his back and his supine AHI was 11 vs. A non-supine AHI of 20.3/hr.  His OSA was REM dominant with a REM AHI of 25.5/hr.

## 2013-02-12 NOTE — Telephone Encounter (Signed)
Tried to call pt to offer urgent appt next week, no answer - I will e-mail patient as he requested the study results to be emailed to him rather than mailed.  I will request his presence at the appointment at 10:30 AM on Tuesday next week.  Appointment has been tentatively entered. -sh

## 2013-02-12 NOTE — Telephone Encounter (Signed)
Please offer this patient an urgent spot in a WID slot. Next week.

## 2013-02-18 ENCOUNTER — Institutional Professional Consult (permissible substitution): Payer: BC Managed Care – PPO | Admitting: Neurology

## 2013-02-18 ENCOUNTER — Encounter: Payer: Self-pay | Admitting: Neurology

## 2013-02-18 ENCOUNTER — Ambulatory Visit (INDEPENDENT_AMBULATORY_CARE_PROVIDER_SITE_OTHER): Payer: BC Managed Care – PPO | Admitting: Neurology

## 2013-02-18 VITALS — BP 116/74 | HR 60 | Resp 16 | Ht 65.0 in | Wt 172.0 lb

## 2013-02-18 DIAGNOSIS — G4733 Obstructive sleep apnea (adult) (pediatric): Secondary | ICD-10-CM

## 2013-02-18 NOTE — Progress Notes (Signed)
[facility]  Provider:  Dr Mikeala Girdler Referring Provider: Kari Baars, MD Primary Care Physician:  Kari Baars, MD  Chief Complaint  Patient presents with  . New Evaluation    sleep study results, [name], rm 10    HPI:  [name] is a 57 y.o. male here as a referral from Dr. [name] for  Sleep apnea , after a SPLIT study on 01-28-2013 with an AHI of 12.4 , and RDI of 12.4. There was little supine accentuation, but strong REM accentuation.  He has a history of CAD and had 4 years ago 2 Bypass surgeries.  He plans to leave for 3 month in the  next 5 days. He will go for 3 month to [place] and to places without electricity - this may affect his compliance- Data.  He is not a candiate for dental device or ENT procedure - the REM dependent apnea, but is motivated to lose weight.   The patient never  underwent any neck, jaw or back surgery. Again,  4 years ago he had cardiac bypass surgery, but he was asymptomatic and did not have shortness of breath or chest pain / pressure.   Accordingly he was rather surprised when he learned that he had a coronary occlusion.  The patient's bedtime is around midnight or just slightly before, he will ride his in the morning at 6:30 to 7 AM. On some nights he will have one bathroom break, but most nights he will sleep through. Estimated sleep time for this patient would be between 6 and 7 hours at night. His spouse shares  his bedroom. Gaynelle Adu has not reported any kicking or clonic movements, most mornings the patient will wake up refreshed he does not and endorse a dry mouth ,  nor headaches in the morning. The patient reports that he used to snore prior to his cardiac surgery but has not been a heavy snorer since. His wife would occasionally mention him to snore when he has an upper airway infection or a viral or allergic rhinitis.  Re-evaluation was initiated based on 2 risk factors coronary artery disease, and weight gain since a  recent trip to the [place] The patient's mother suffered a stroke at age 81 he has 6 siblings 3 brothers and 3 sisters with high incidence of coronary artery disease in his male siblings and diabetes in 2 brothers and one sister.        Review of Systems: Out of a complete 14 system review, the patient complains of only the following symptoms, and all other reviewed systems are negative. Epworth 8 points.  FSS 32 .   History   Social History  . Marital Status: Married    Spouse Name: N/A    Number of Children: 1  . Years of Education: college   Occupational History  . [employer]    Social History Main Topics  . Smoking status: Never Smoker   . Smokeless tobacco: Never Used  . Alcohol Use: No  . Drug Use: No  . Sexually Active: Not Currently   Other Topics Concern  . Not on file   Social History Narrative   Foreign travel. Originally from [place] in [place].      Family History  Problem Relation Age of Onset  . Heart attack Brother 48  . Diabetes Brother   . High Cholesterol Brother   . Stroke Mother 60  . Coronary artery disease    . Diabetes Sister   .  High Cholesterol Sister     Past Medical History  Diagnosis Date  . Coronary artery disease     ETT-myoview 01/27/09: large basal to apical anterior and anteroseptal reversible defect.  ECG also positive  for ischemia.  LHC (6/10) showed EF 65% totally occluded LAD with collaterals from RCA, 30-40% proximal CFX stenosis  . Hyperlipidemia   . GERD (gastroesophageal reflux disease)   . Diabetes mellitus     pre diabetes    Past Surgical History  Procedure Laterality Date  . Doppler echocardiography      (6/10): EF 60-65%, NO AS/AI, no MR, normal RV.  Marland Kitchen Open heart surgery  01/2009    Current Outpatient Prescriptions  Medication Sig Dispense Refill  . ALPHA LIPOIC ACID PO Take 200 mg by mouth daily.      . Ascorbic Acid (VITAMIN C) 1000 MG tablet Take 1,000 mg by mouth daily.        Marland Kitchen  aspirin 325 MG tablet Take 325 mg by mouth daily.        Marland Kitchen atorvastatin (LIPITOR) 80 MG tablet Take 1 tablet (80 mg total) by mouth daily.  30 tablet  12  . cholecalciferol (VITAMIN D) 1000 UNITS tablet Take 1,000 Units by mouth daily.        . Coenzyme Q10 (CO Q 10 PO) Take 1 capsule by mouth daily.      Marland Kitchen FREESTYLE LITE test strip USE TO TEST BLOOD SUGARS TWICE A DAY  200 each  0  . lansoprazole (PREVACID) 30 MG capsule Take 30 mg by mouth. As needed       . lisinopril (PRINIVIL,ZESTRIL) 2.5 MG tablet Take 1 tablet (2.5 mg total) by mouth 2 (two) times daily.  180 tablet  3  . metformin (FORTAMET) 500 MG (OSM) 24 hr tablet Take 500 mg by mouth as needed.       . metoprolol tartrate (LOPRESSOR) 25 MG tablet TAKE ONE-HALF TABLET BY MOUTH TWICE DAILY  45 tablet  4  . Multiple Vitamin (MULTIVITAMINS PO) Take by mouth.        . niacin 500 MG tablet Two tablets in the evening       . nitroGLYCERIN (NITROSTAT) 0.4 MG SL tablet Place 1 tablet (0.4 mg total) under the tongue every 5 (five) minutes as needed.  90 tablet  3   No current facility-administered medications for this visit.    Allergies as of 02/18/2013  . (No Known Allergies)    Vitals: BP 116/74  Pulse 60  Resp 16  Ht 5\' 5"  (1.651 m)  Wt 172 lb (78.019 kg)  BMI 28.62 kg/m2 Last Weight:  Wt Readings from Last 1 Encounters:  02/18/13 172 lb (78.019 kg)   Last Height:   Ht Readings from Last 1 Encounters:  02/18/13 5\' 5"  (1.651 m)     Physical exam:  General: The patient is awake, alert and appears not in acute distress. The patient is well groomed. Head: Normocephalic, atraumatic. Neck is supple. Mallampati 2 , neck circumference: 14 inches  Cardiovascular:  Regular rate and rhythm, without  murmurs or carotid bruit, and without distended neck veins. Respiratory: Lungs are clear to auscultation. Skin:  Without evidence of edema, or rash Trunk: BMI is 32, elevated and patient  has normal posture.  Neurologic exam  : The patient is awake and alert, oriented to place and time.  Memory subjective  described as intact. There is a normal attention span & concentration ability. Speech is fluent without  dysarthria, dysphonia or aphasia. Mood and affect are appropriate.  Cranial nerves: Pupils are equal and briskly reactive to light. Funduscopic exam without evidence of pallor or edema. Extraocular movements  in vertical and horizontal planes intact and without nystagmus. Visual fields by finger perimetry are intact. Hearing to finger rub intact.  Facial sensation intact to fine touch. Facial motor strength is symmetric and tongue and uvula move midline.  Motor exam:   Normal tone and normal muscle bulk and symmetric normal strength in all extremities.  Sensory:  Fine touch, pinprick and vibration were tested in all extremities. Proprioception is  normal.  Coordination: Rapid alternating movements in the fingers/hands is tested and normal.  Deep tendon reflexes: in the  upper and lower extremities are symmetric and intact.    Assessment:  After physical and neurologic examination, review of laboratory studies, imaging, neurophysiology testing and pre-existing records, assessment will be reviewed on the problem list.   OSA mild  With immediate need to start CPAP at 6 cm water.   Plan:  Treatment plan and additional workup will be reviewed under Problem List.

## 2013-02-18 NOTE — Patient Instructions (Signed)
CPAP and BIPAP CPAP and BIPAP are methods of helping you breathe. CPAP stands for "continuous positive airway pressure." BIPAP stands for "bi-level positive airway pressure." Both CPAP and BIPAP are provided by a small machine with a flexible plastic tube that attaches to a plastic mask that goes over your nose or mouth. Air is blown into your air passages through your nose or mouth. This helps to keep your airways open and helps to keep you breathing well. The amount of pressure that is used to blow the air into your air passages can be set on the machine. The pressure setting is based on your needs. With CPAP, the amount of pressure stays the same while you breathe in and out. With BIPAP, the amount of pressure changes when you inhale and exhale. Your caregiver will recommend whether CPAP or BIPAP would be more helpful for you.  CPAP and BIPAP can be helpful for both adults and children with:  Sleep apnea.  Chronic Obstructive Pulmonary Disease (COPD), a condition like emphysema.  Diseases which weaken the muscles of the chest such as muscular dystrophy or neurological diseases.  Other problems that cause breathing to be weak or difficult. USE OF CPAP OR BIPAP The respiratory therapist or technician will help you get used to wearing the mask. Some people feel claustrophobic (a trapped or closed in feeling) at first, because the mask needs to be fairly snug on your face.   It may help you to get used to the mask gradually, by first holding the mask loosely over your nose or mouth using a low pressure setting on the machine. Gradually the mask can be applied more snugly with increased pressure. You can also gradually increase the amount of time the mask is used.  People with sleep apnea will use the mask and machine at night when they are sleeping. Others, like those with ALS or other breathing difficulties, may need the CPAP or BIPAP all the time.  If the first mask you try does not fit well, or  is uncomfortable, there are other types and sizes that can be tried.  If you tend to breathe through your mouth, a chin strap may be applied to help keep your mouth closed (if you are using a nasal mask).  The CPAP and BIPAP machines have alarms that may sound if the mask comes off or develops a leak.  You should not eat or drink while the CPAP or BIPAP is on. Food or fluids could get pushed into your lungs by the pressure of the CPAP or BIPAP. Sometimes CPAP or BIPAP machines are ordered for home use. If you are going to use the CPAP or BIPAP machine at home, follow these instructions  CPAP or BIPAP machines can be rented or purchased through home health care companies. There are many different brands of machines available. If you rent a machine before purchasing you may find which particular machine works well for you.  Ask questions if there is something you do not understand when picking out your machine.  Place your CPAP or BIPAP machine on a secure table or stand near an electrical outlet.  Know where the On/Off switch is.  Follow your doctor's instructions for how to set the pressure on your machine and when you should use it.  Do not smoke! Tobacco smoke residue can damage the machine. SEEK IMMEDIATE MEDICAL CARE IF:   You have redness or open areas around your nose or mouth.  You have trouble operating   the CPAP or BIPAP machine.  You cannot tolerate wearing the CPAP or BIPAP mask.  You have any questions or concerns. Document Released: 04/28/2004 Document Revised: 10/23/2011 Document Reviewed: 07/28/2008 ExitCare Patient Information 2014 [employer]. Sleep Apnea Sleep apnea is disorder that affects a person's sleep. A person with sleep apnea has abnormal pauses in their breathing when they sleep. It is hard for them to get a good sleep. This makes a person tired during the day. It also can lead to other physical problems. There are three types of sleep apnea. One type is  when breathing stops for a short time because your airway is blocked (obstructive sleep apnea). Another type is when the brain sometimes fails to give the normal signal to breathe to the muscles that control your breathing (central sleep apnea). The third type is a combination of the other two types. HOME CARE  Do not sleep on your back. Try to sleep on your side.  Take all medicine as told by your doctor.  Avoid alcohol, calming medicines (sedatives), and depressant drugs.  Try to lose weight if you are overweight. Talk to your doctor about a healthy weight goal. Your doctor may have you use a device that helps to open your airway. It can help you get the air that you need. It is called a positive airway pressure (PAP) device. There are three types of PAP devices:  Continuous positive airway pressure (CPAP) device.  Nasal expiratory positive airway pressure (EPAP) device.  Bilevel positive airway pressure (BPAP) device. MAKE SURE YOU:  Understand these instructions.  Will watch your condition.  Will get help right away if you are not doing well or get worse. Document Released: 05/09/2008 Document Revised: 07/17/2012 Document Reviewed: 12/02/2011 ExitCare Patient Information 2014 [employer].  

## 2013-02-19 ENCOUNTER — Other Ambulatory Visit: Payer: Self-pay | Admitting: Cardiology

## 2013-02-20 ENCOUNTER — Other Ambulatory Visit: Payer: Self-pay | Admitting: Cardiology

## 2013-02-24 ENCOUNTER — Other Ambulatory Visit: Payer: Self-pay | Admitting: *Deleted

## 2013-02-24 DIAGNOSIS — E119 Type 2 diabetes mellitus without complications: Secondary | ICD-10-CM

## 2013-02-24 MED ORDER — FREESTYLE LITE TEST VI STRP: ORAL_STRIP | Status: AC

## 2013-06-19 ENCOUNTER — Other Ambulatory Visit: Payer: Self-pay

## 2013-07-31 ENCOUNTER — Encounter: Payer: Self-pay | Admitting: Neurology

## 2013-07-31 ENCOUNTER — Telehealth: Payer: Self-pay | Admitting: Cardiology

## 2013-07-31 ENCOUNTER — Encounter: Payer: Self-pay | Admitting: *Deleted

## 2013-07-31 DIAGNOSIS — I25810 Atherosclerosis of coronary artery bypass graft(s) without angina pectoris: Secondary | ICD-10-CM

## 2013-07-31 DIAGNOSIS — E785 Hyperlipidemia, unspecified: Secondary | ICD-10-CM

## 2013-07-31 NOTE — Telephone Encounter (Signed)
I have scheduled fasting lab appt for BMET/Lipid profile for 10/01/13 prior to pt's appt with Dr Shirlee Latch. I have also mailed MyChart activation letter to pt.

## 2013-07-31 NOTE — Telephone Encounter (Signed)
New Problem:  Pt is requesting he have blood work checked at the time of his Feb 2015 appt. Pt is also requesting the nurse help him set up his MyChart. Pt would like a call back.

## 2013-08-06 ENCOUNTER — Encounter: Payer: Self-pay | Admitting: Neurology

## 2013-08-18 ENCOUNTER — Other Ambulatory Visit: Payer: Self-pay | Admitting: Cardiology

## 2013-08-20 NOTE — Telephone Encounter (Signed)
Called patient 08/20/13 and LM for patient.  Told him of Dr. Oliva Bustardohmeier's advice from 08/06/13 [facility].  I also let him know [facility] his concerns of non-working equipment and asked them to contact him for assistance.  Told him to call if there were any other questions or concerns. -sh

## 2013-08-22 NOTE — Telephone Encounter (Signed)
    Respitatory team spoke to the pts family member and he has returned to [place] and will not be back until February.  ([facility]

## 2013-10-01 ENCOUNTER — Encounter: Payer: Self-pay | Admitting: Cardiology

## 2013-10-01 ENCOUNTER — Ambulatory Visit (INDEPENDENT_AMBULATORY_CARE_PROVIDER_SITE_OTHER): Payer: BC Managed Care – PPO | Admitting: Cardiology

## 2013-10-01 ENCOUNTER — Ambulatory Visit: Payer: BC Managed Care – PPO | Admitting: Cardiology

## 2013-10-01 ENCOUNTER — Other Ambulatory Visit: Payer: BC Managed Care – PPO

## 2013-10-01 VITALS — BP 110/70 | HR 57 | Ht 65.0 in | Wt 176.0 lb

## 2013-10-01 DIAGNOSIS — I25810 Atherosclerosis of coronary artery bypass graft(s) without angina pectoris: Secondary | ICD-10-CM

## 2013-10-01 DIAGNOSIS — E785 Hyperlipidemia, unspecified: Secondary | ICD-10-CM

## 2013-10-01 NOTE — Progress Notes (Signed)
Patient ID: [name], male   DOB: February 03, 1956, 58 y.o.   MRN: [id number] PCP: Dr. [name]  58 yo with h/o hyperlipidemia and CAD s/p CABG returns for evaluation.  In general, he has done quite well since his operation.  He is now walking 30-60 minutes most days on the treadmill.  No exertional chest pain or shortness of breath.   He still gets occasional reflux symptoms.  He was in [place] recently and plans to go to the [place] this year.    Labs (7/10): LDL 61, HDL 31 Labs (1/11): TSH normal, K 4.6, creatinine 0.9, LDL 60, HDL 34, TG 65 Labs (3/12): K 5, creatinine 0.8, LFTs normal , LDL 63, HDL 39 Labs (9/12): LDL 75, HDL 89 Labs (1/13): K 4.4, creatinine 0.88, LDL 65, HDL 47, TSH normal Labs (7/13): LDL 69, HDL 41 Labs (4/14): K 4, creatinine 1.0, LDL 73, HDL 38  Allergies:  No Known Drug Allergies  Past Medical History: 1.  CAD:  ETT-myoview 01/27/09: large basal to apical anterior and anteroseptal reversible defect.  ECG also positive for ischemia.  LHC (6/10) showed EF 65%, totally occluded LAD with collaterals from RCA, 30-40% proximal CFX stenosis.  Patient had CABG by Dr. Tyrone Sage: LIMA-LAD, SVG-D2.  ETT (3/12): 7'51", borderline 1 mm ST depression in V3/V4 at peak exercise with immediate resolution with rest, no chest pain, overall thought to be low risk.  Stress myoview (1/13) at [facility] was normal.  2.  Hyperlipidemia 3.  Type II diabetes: diet-controlled 4.  GERD 5.  Echo (6/10): EF 60-65%, No AS/AI, no MR, normal RV.  6. 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 Originally from [place] in [place].  Married with 1 son. Nonsmoker, no ETOH.   Current Outpatient Prescriptions  Medication Sig Dispense Refill  . ALPHA LIPOIC ACID PO Take 200 mg by mouth daily.      . Ascorbic  Acid (VITAMIN C) 1000 MG tablet Take 1,000 mg by mouth daily.        Marland Kitchen aspirin 325 MG tablet Take 325 mg by mouth daily.        Marland Kitchen atorvastatin (LIPITOR) 80 MG tablet Take 1 tablet (80 mg total) by mouth daily.  30 tablet  12  . cholecalciferol (VITAMIN D) 1000 UNITS tablet Take 1,000 Units by mouth daily.        . Coenzyme Q10 (CO Q 10 PO) Take 1 capsule by mouth daily.      Marland Kitchen FREESTYLE LITE test strip USE TO TEST BLOOD SUGARS TWICE A DAY  200 each  0  . lansoprazole (PREVACID) 30 MG capsule Take 30 mg by mouth. As needed       . lisinopril (PRINIVIL,ZESTRIL) 2.5 MG tablet Take 1 tablet (2.5 mg total) by mouth 2 (two) times daily.  180 tablet  3  . metformin (FORTAMET) 500 MG (OSM) 24 hr tablet Take 500 mg by mouth as needed.       . metoprolol tartrate (LOPRESSOR) 25 MG tablet TAKE ONE-HALF TABLET BY MOUTH TWICE DAILY  90 tablet  1  . Multiple Vitamin (MULTIVITAMINS PO) Take by mouth.        . nitroGLYCERIN (NITROSTAT) 0.4 MG SL tablet Place 1 tablet (0.4 mg total) under the tongue every 5 (five) minutes as needed.  90 tablet  3   No current facility-administered medications for this visit.   BP 110/70  Pulse 57  Ht 5\' 5"  (1.651 m)  Wt 79.833 kg (176 lb)  BMI 29.29 kg/m2 General: NAD Neck: No JVD, no thyromegaly or thyroid nodule.  Lungs: Clear to auscultation bilaterally with normal respiratory effort. CV: Nondisplaced PMI.  Heart regular S1/S2, no S3/S4, no murmur.  No peripheral edema.  No carotid bruit.  Normal pedal pulses.  Abdomen: Soft, nontender, no hepatosplenomegaly, no distention.  Neurologic: Alert and oriented x 3.  Psych: Normal affect. Extremities: No clubbing or cyanosis.  Assessment/Plan: 1. CAD: Stable status post CABG.  Good exercise tolerance.  Continue ASA, atorvastatin, metoprolol, lisinopril.  BMET today.  2. Hyperlipidemia: Will arrange to check fasting lipids.  Continue atorvastatin.  I think that he should stop Niacin given no evidence for benefit when also  on a statin.    Marca AnconaDalton McLean 10/01/2013

## 2013-10-01 NOTE — Patient Instructions (Signed)
Stop Niacin.  Your physician recommends that you return for a FASTING lipid profile.  Your physician wants you to follow-up in: 1 year with Dr Shirlee LatchMcLean. (February 2016).  You will receive a reminder letter in the mail two months in advance. If you don't receive a letter, please call our office to schedule the follow-up appointment.

## 2013-10-09 ENCOUNTER — Other Ambulatory Visit: Payer: BC Managed Care – PPO

## 2013-10-15 ENCOUNTER — Other Ambulatory Visit: Payer: BC Managed Care – PPO

## 2013-10-16 ENCOUNTER — Ambulatory Visit (INDEPENDENT_AMBULATORY_CARE_PROVIDER_SITE_OTHER): Payer: BC Managed Care – PPO | Admitting: *Deleted

## 2013-10-16 DIAGNOSIS — I25810 Atherosclerosis of coronary artery bypass graft(s) without angina pectoris: Secondary | ICD-10-CM

## 2013-10-16 DIAGNOSIS — E785 Hyperlipidemia, unspecified: Secondary | ICD-10-CM

## 2013-10-16 LAB — BASIC METABOLIC PANEL
BUN: 12 mg/dL (ref 6–23)
CALCIUM: 9.1 mg/dL (ref 8.4–10.5)
CO2: 27 meq/L (ref 19–32)
Chloride: 104 mEq/L (ref 96–112)
Creatinine, Ser: 0.9 mg/dL (ref 0.4–1.5)
GFR: 91.57 mL/min (ref >60.00)
GLUCOSE: 95 mg/dL (ref 70–99)
Potassium: 4.1 mEq/L (ref 3.5–5.1)
SODIUM: 138 meq/L (ref 135–145)

## 2013-10-16 LAB — HEPATIC FUNCTION PANEL
ALBUMIN: 3.9 g/dL (ref 3.5–5.2)
ALK PHOS: 50 U/L (ref 39–117)
ALT: 24 U/L (ref 0–53)
AST: 23 U/L (ref 0–37)
Bilirubin, Direct: 0.1 mg/dL (ref 0.0–0.3)
TOTAL PROTEIN: 6.7 g/dL (ref 6.0–8.3)
Total Bilirubin: 1.1 mg/dL (ref 0.3–1.2)

## 2013-10-16 LAB — LIPID PANEL
Cholesterol: 113 mg/dL (ref 0–200)
HDL: 37.6 mg/dL — AB (ref >39.00)
LDL Cholesterol: 63 mg/dL (ref 0–99)
Total CHOL/HDL Ratio: 3
Triglycerides: 62 mg/dL (ref 0.0–149.0)
VLDL: 12.4 mg/dL (ref 0.0–40.0)

## 2013-10-17 NOTE — Progress Notes (Signed)
Results released to MyChart with Dr Alford HighlandMcLean's notes.

## 2013-11-11 ENCOUNTER — Encounter: Payer: Self-pay | Admitting: Neurology

## 2013-12-10 ENCOUNTER — Other Ambulatory Visit: Payer: Self-pay | Admitting: Cardiology

## 2014-01-14 ENCOUNTER — Encounter: Payer: Self-pay | Admitting: Cardiology

## 2014-02-04 ENCOUNTER — Other Ambulatory Visit: Payer: Self-pay | Admitting: Cardiology

## 2014-02-21 ENCOUNTER — Encounter: Payer: Self-pay | Admitting: Neurology

## 2014-05-21 ENCOUNTER — Other Ambulatory Visit: Payer: Self-pay

## 2014-05-21 MED ORDER — LISINOPRIL 2.5 MG PO TABS: ORAL_TABLET | ORAL | Status: AC

## 2014-08-06 ENCOUNTER — Other Ambulatory Visit: Payer: Self-pay | Admitting: Cardiology

## 2014-08-21 ENCOUNTER — Other Ambulatory Visit: Payer: Self-pay | Admitting: Cardiology

## 2014-08-26 ENCOUNTER — Other Ambulatory Visit: Payer: Self-pay | Admitting: Cardiology

## 2014-09-03 ENCOUNTER — Other Ambulatory Visit: Payer: Self-pay | Admitting: Cardiology

## 2014-11-02 ENCOUNTER — Ambulatory Visit: Payer: BC Managed Care – PPO | Admitting: Cardiology

## 2015-03-04 ENCOUNTER — Encounter: Payer: Self-pay | Admitting: Cardiology

## 2015-05-04 ENCOUNTER — Telehealth: Payer: Self-pay | Admitting: *Deleted

## 2015-05-04 NOTE — Telephone Encounter (Signed)
Pt states he is going to [place] next week. The Health Department has recommended that he take a medication for malaria prevention -Malarone /100mg . Pt asking if [facility] with Dr Shirlee Latch for him to take this medication.  Pt advised I will forward to Dr Shirlee Latch for review.

## 2015-05-04 NOTE — Telephone Encounter (Signed)
I think that Malarone should be ok for him to use.

## 2015-05-05 NOTE — Telephone Encounter (Signed)
Notified patient of Dr. Alford Highland response to question regarding pt taking Malarone medication. Patient verbalized appreciation for fast response.

## 2015-05-06 ENCOUNTER — Other Ambulatory Visit: Payer: Self-pay | Admitting: *Deleted

## 2015-05-06 MED ORDER — NITROGLYCERIN 0.4 MG SL SUBL: 0.4000 mg | SUBLINGUAL_TABLET | SUBLINGUAL | Status: DC | PRN

## 2015-05-07 ENCOUNTER — Encounter: Payer: Self-pay | Admitting: Cardiology

## 2015-05-07 ENCOUNTER — Ambulatory Visit (INDEPENDENT_AMBULATORY_CARE_PROVIDER_SITE_OTHER): Payer: BLUE CROSS/BLUE SHIELD | Admitting: Cardiology

## 2015-05-07 VITALS — BP 116/60 | HR 63 | Ht 65.0 in | Wt 174.4 lb

## 2015-05-07 DIAGNOSIS — I25810 Atherosclerosis of coronary artery bypass graft(s) without angina pectoris: Secondary | ICD-10-CM | POA: Diagnosis not present

## 2015-05-07 DIAGNOSIS — E785 Hyperlipidemia, unspecified: Secondary | ICD-10-CM | POA: Diagnosis not present

## 2015-05-07 NOTE — Patient Instructions (Signed)
Medication Instructions: - no changes  Labwork: - none  Procedures/Testing: - none  Follow-Up: - Your physician wants you to follow-up in: 1 year with Dr. Shirlee Latch,. You will receive a reminder letter in the mail two months in advance. If you don't receive a letter, please call our office to schedule the follow-up appointment.  Any Additional Special Instructions Will Be Listed Below (If Applicable). - none

## 2015-05-09 NOTE — Progress Notes (Signed)
Patient ID: [name], male   DOB: October 03, 1955, 59 y.o.   MRN: [id number] PCP: Dr. [name]  59 yo with h/o hyperlipidemia and CAD s/p CABG returns for evaluation.  In general, he has done quite well since his operation.  He is walks 3+ miles daily.  No exertional chest pain or shortness of breath. Weight down 2 lbs since last appointment.  He will be traveling to [place] in 3 wks.  He is using CPAP every night (when he is not traveling in an area without electricity).    Labs (7/10): LDL 61, HDL 31 Labs (1/11): TSH normal, K 4.6, creatinine 0.9, LDL 60, HDL 34, TG 65 Labs (3/12): K 5, creatinine 0.8, LFTs normal , LDL 63, HDL 39 Labs (9/12): LDL 75, HDL 89 Labs (1/13): K 4.4, creatinine 0.88, LDL 65, HDL 47, TSH normal Labs (7/13): LDL 69, HDL 41 Labs (4/14): K 4, creatinine 1.0, LDL 73, HDL 38 Labs (6/15): K 4.7, creatinine 0.9, LDL 78, HDL 35  ECG: NSR, anterolateral and anteroseptal T wave inversions.    Allergies:  No Known Drug Allergies  Past Medical History: 1.  CAD:  ETT-myoview 01/27/09: large basal to apical anterior and anteroseptal reversible defect.  ECG also positive for ischemia.  LHC (6/10) showed EF 65%, totally occluded LAD with collaterals from RCA, 30-40% proximal CFX stenosis.  Patient had CABG by Dr. Tyrone Sage: LIMA-LAD, SVG-D2.  ETT (3/12): 7'51", borderline 1 mm ST depression in V3/V4 at peak exercise with immediate resolution with rest, no chest pain, overall thought to be low risk.  St[facility] my[facility]iew (1/13) at [facility] was normal.  2.  Hyperlipidemia 3.  Type II diabetes: diet-controlled 4.  GERD 5.  Echo (6/10): EF 60-65%, No AS/AI, no MR, normal RV.  6. 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 Originally from [place] in [place].  Married with 1 son. Nonsmoker, no ETOH.    Current Outpatient Prescriptions  Medication Sig Dispense Refill  . ALPHA LIPOIC ACID PO Take 200 mg by mouth daily.    . Ascorbic Acid (VITAMIN C) 1000 MG tablet Take 1,000 mg by mouth daily.      Marland Kitchen aspirin 325 MG tablet Take 325 mg by mouth daily.      Marland Kitchen atorvastatin (LIPITOR) 80 MG tablet Take 1 tablet (80 mg total) by mouth daily. 30 tablet 12  . cholecalciferol (VITAMIN D) 1000 UNITS tablet Take 1,000 Units by mouth daily.      . Coenzyme Q10 (CO Q 10 PO) Take 1 capsule by mouth daily.    Marland Kitchen FREESTYLE LITE test strip USE TO TEST BLOOD SUGARS TWICE A DAY 200 each 0  . lansoprazole (PREVACID) 30 MG capsule Take 30 mg by mouth. As needed     . lisinopril (PRINIVIL,ZESTRIL) 2.5 MG tablet TAKE ONE TABLET BY MOUTH TWICE DAILY 180 tablet 2  . metformin (FORTAMET) 500 MG (OSM) 24 hr tablet Take 500 mg by mouth daily with breakfast.     . metoprolol tartrate (LOPRESSOR) 25 MG tablet TAKE ONE-HALF TABLET BY MOUTH TWICE DAILY 90 tablet 0  . Multiple Vitamin (MULTIVITAMINS PO) Take 1 tablet by mouth daily. CENTRUM SILVER    . nitroGLYCERIN (NITROSTAT) 0.4 MG SL tablet Place 1 tablet (0.4 mg total) under the tongue every 5 (five) minutes as needed  for chest pain. 25 tablet 0   No current facility-administered medications for this visit.   BP 116/60 mmHg  Pulse 63  Ht  (1.651 m)  Wt 174 lb 6.4 oz (79.107 kg)  BMI 29.02 kg/m2  SpO2 96% General: NAD Neck: No JVD, no thyromegaly or thyroid nodule.  Lungs: Clear to auscultation bilaterally with normal respiratory effort. CV: Nondisplaced PMI.  Heart regular S1/S2, no S3/S4, no murmur.  No peripheral edema.  No carotid bruit.  Normal pedal pulses.  Abdomen: Soft, nontender, no hepatosplenomegaly, no distention.  Neurologic: Alert and oriented x 3.  Psych: Normal affect. Extremities: No clubbing or cyanosis.  Assessment/Plan: 1. CAD: Stable status post CABG.  Good exercise tolerance.  Continue ASA, atorvastatin, metoprolol, lisinopril.  OK  to decrease ASA dose to 81 mg daily. 2. Hyperlipidemia: Will call PCP for most recent lipid profile.   Marca Ancona 05/09/2015

## 2016-02-24 DIAGNOSIS — Z125 Encounter for screening for malignant neoplasm of prostate: Secondary | ICD-10-CM | POA: Diagnosis not present

## 2016-02-24 DIAGNOSIS — Z0000 Encounter for general adult medical examination without abnormal findings: Secondary | ICD-10-CM | POA: Diagnosis not present

## 2016-02-24 DIAGNOSIS — E119 Type 2 diabetes mellitus without complications: Secondary | ICD-10-CM | POA: Diagnosis not present

## 2016-02-25 DIAGNOSIS — Z1212 Encounter for screening for malignant neoplasm of rectum: Secondary | ICD-10-CM | POA: Diagnosis not present

## 2016-03-02 ENCOUNTER — Other Ambulatory Visit (INDEPENDENT_AMBULATORY_CARE_PROVIDER_SITE_OTHER): Payer: BLUE CROSS/BLUE SHIELD

## 2016-03-02 ENCOUNTER — Encounter: Payer: Self-pay | Admitting: Gastroenterology

## 2016-03-02 ENCOUNTER — Ambulatory Visit (INDEPENDENT_AMBULATORY_CARE_PROVIDER_SITE_OTHER): Payer: BLUE CROSS/BLUE SHIELD | Admitting: Gastroenterology

## 2016-03-02 VITALS — BP 121/80 | HR 72 | Ht 65.0 in | Wt 169.0 lb

## 2016-03-02 DIAGNOSIS — D500 Iron deficiency anemia secondary to blood loss (chronic): Secondary | ICD-10-CM

## 2016-03-02 DIAGNOSIS — I10 Essential (primary) hypertension: Secondary | ICD-10-CM | POA: Diagnosis not present

## 2016-03-02 DIAGNOSIS — Z125 Encounter for screening for malignant neoplasm of prostate: Secondary | ICD-10-CM | POA: Diagnosis not present

## 2016-03-02 DIAGNOSIS — E784 Other hyperlipidemia: Secondary | ICD-10-CM | POA: Diagnosis not present

## 2016-03-02 DIAGNOSIS — E119 Type 2 diabetes mellitus without complications: Secondary | ICD-10-CM | POA: Diagnosis not present

## 2016-03-02 DIAGNOSIS — Z0000 Encounter for general adult medical examination without abnormal findings: Secondary | ICD-10-CM | POA: Diagnosis not present

## 2016-03-02 DIAGNOSIS — R195 Other fecal abnormalities: Secondary | ICD-10-CM

## 2016-03-02 DIAGNOSIS — I2510 Atherosclerotic heart disease of native coronary artery without angina pectoris: Secondary | ICD-10-CM | POA: Diagnosis not present

## 2016-03-02 DIAGNOSIS — Z1389 Encounter for screening for other disorder: Secondary | ICD-10-CM | POA: Diagnosis not present

## 2016-03-02 DIAGNOSIS — Z6828 Body mass index (BMI) 28.0-28.9, adult: Secondary | ICD-10-CM | POA: Diagnosis not present

## 2016-03-02 LAB — IBC PANEL
Iron: 71 ug/dL (ref 42–165)
SATURATION RATIOS: 20.6 % (ref 20.0–50.0)
Transferrin: 246 mg/dL (ref 212.0–360.0)

## 2016-03-02 LAB — FERRITIN: Ferritin: 79.5 ng/mL (ref 22.0–322.0)

## 2016-03-02 LAB — CBC WITH DIFFERENTIAL/PLATELET
BASOS ABS: 0 10*3/uL (ref 0.0–0.1)
Basophils Relative: 0.5 % (ref 0.0–3.0)
EOS ABS: 0.2 10*3/uL (ref 0.0–0.7)
Eosinophils Relative: 2.8 % (ref 0.0–5.0)
HCT: 46.8 % (ref 39.0–52.0)
Hemoglobin: 15.5 g/dL (ref 13.0–17.0)
LYMPHS ABS: 1.4 10*3/uL (ref 0.7–4.0)
LYMPHS PCT: 25.1 % (ref 12.0–46.0)
MCHC: 33.2 g/dL (ref 30.0–36.0)
MCV: 80.5 fl (ref 78.0–100.0)
Monocytes Absolute: 0.5 10*3/uL (ref 0.1–1.0)
Monocytes Relative: 9.1 % (ref 3.0–12.0)
NEUTROS ABS: 3.6 10*3/uL (ref 1.4–7.7)
NEUTROS PCT: 62.5 % (ref 43.0–77.0)
PLATELETS: 276 10*3/uL (ref 150.0–400.0)
RBC: 5.81 Mil/uL (ref 4.22–5.81)
RDW: 13.7 % (ref 11.5–15.5)
WBC: 5.7 10*3/uL (ref 4.0–10.5)

## 2016-03-02 LAB — IGA: IGA: 276 mg/dL (ref 68–378)

## 2016-03-02 LAB — FOLATE

## 2016-03-02 LAB — VITAMIN B12: VITAMIN B 12: 729 pg/mL (ref 211–911)

## 2016-03-02 MED ORDER — NA SULFATE-K SULFATE-MG SULF 17.5-3.13-1.6 GM/177ML PO SOLN: 1.0000 | Freq: Once | ORAL | Status: DC

## 2016-03-02 NOTE — Progress Notes (Signed)
[name]    [id number]    [birth year]  Primary Care Physician:[name]  Referring Physician: Martha ClanWilliam Shaw, Mcdonald [address] [place], [place] [address]  Chief complaint:  Heme-positive stool  HPI: 60 year old male with history of CAD status post CABG here for evaluation of heme-positive stool. Patient feels well overall and denies any GI specific symptoms, no heartburn, dysphagia, odynophagia, constipation, diarrhea, nausea, vomiting, abdominal pain, melena or bright red blood per rectum. Few days ago he noticed that he had a bleeding gum and wonders if that's the reason he has heme-positive stool. Per patient he had colonoscopy after he turned 52 and thinks was done about 6 years ago in [facility]. He is currently taking aspirin 325 mg daily. Denies any other nsaids.   Outpatient Encounter Prescriptions as of 03/02/2016  Medication Sig  . ALPHA LIPOIC ACID PO Take 200 mg by mouth daily.  . Ascorbic Acid (VITAMIN C) 1000 MG tablet Take 1,000 mg by mouth daily.    Marland Kitchen. aspirin 325 MG tablet Take 325 mg by mouth daily.    Marland Kitchen. atorvastatin (LIPITOR) 80 MG tablet Take 1 tablet (80 mg total) by mouth daily.  . cholecalciferol (VITAMIN D) 1000 UNITS tablet Take 1,000 Units by mouth daily.    . Coenzyme Q10 (CO Q 10 PO) Take 1 capsule by mouth daily.  Marland Kitchen. FREESTYLE LITE test strip USE TO TEST BLOOD SUGARS TWICE A DAY  . lansoprazole (PREVACID) 30 MG capsule Take 30 mg by mouth. As needed   . lisinopril (PRINIVIL,ZESTRIL) 2.5 MG tablet TAKE ONE TABLET BY MOUTH TWICE DAILY  . metformin (FORTAMET) 500 MG (OSM) 24 hr tablet Take 500 mg by mouth daily with breakfast.   . metoprolol tartrate (LOPRESSOR) 25 MG tablet TAKE ONE-HALF TABLET BY MOUTH TWICE DAILY  . Multiple Vitamin (MULTIVITAMINS PO) Take 1 tablet by mouth daily. CENTRUM SILVER  . nitroGLYCERIN (NITROSTAT) 0.4 MG SL tablet Place 1 tablet (0.4 mg total) under the tongue every 5 (five) minutes as needed for  chest pain.   No facility-administered encounter medications on file as of 03/02/2016.    Allergies as of 03/02/2016  . (No Known Allergies)    Past Medical History  Diagnosis Date  . Coronary artery disease     ETT-myoview 01/27/09: large basal to apical anterior and anteroseptal reversible defect.  ECG also positive  for ischemia.  LHC (6/10) showed EF 65% totally occluded LAD with collaterals from RCA, 30-40% proximal CFX stenosis  . Hyperlipidemia   . GERD (gastroesophageal reflux disease)   . Diabetes mellitus     pre diabetes    Past Surgical History  Procedure Laterality Date  . Doppler echocardiography      (6/10): EF 60-65%, NO AS/AI, no MR, normal RV.  Marland Kitchen. Open heart surgery  01/2009    Family History  Problem Relation Age of Onset  . Heart attack Brother 48  . Diabetes Brother   . High Cholesterol Brother   . Stroke Mother [id number]  . Diabetes Sister   . High Cholesterol Sister     Social History   Social History  . Marital Status: Married    Spouse Name: N/A  . Number of Children: 1  . Years of Education: college   Occupational History  . [employer]    Social History Main Topics  . Smoking status: Never Smoker   . Smokeless tobacco: Never Used  . Alcohol Use:  No  . Drug Use: No  . Sexual Activity: Not Currently   Other Topics Concern  . Not on file   Social History Narrative   Foreign travel. Originally from [place] in [place].        Review of systems: Review of Systems  Constitutional: Negative for fever and chills.  HENT: Negative.   Eyes: Negative for blurred vision.  Respiratory: Negative for cough, shortness of breath and wheezing.   Cardiovascular: Negative for chest pain and palpitations.  Gastrointestinal: as per HPI Genitourinary: Negative for dysuria, urgency, frequency and hematuria.  Musculoskeletal: Negative for myalgias, back pain and joint pain.  Skin: Negative for itching and rash.  Neurological: Negative for  dizziness, tremors, focal weakness, seizures and loss of consciousness.  Endo/Heme/Allergies: Negative for environmental allergies.  Psychiatric/Behavioral: Negative for depression, suicidal ideas and hallucinations.  All other systems reviewed and are negative.   Physical Exam: Filed Vitals:   03/02/16 0814  BP: 121/80  Pulse: 72   Gen:      No acute distress HEENT:  EOMI, sclera anicteric Neck:     No masses; no thyromegaly Lungs:    Clear to auscultation bilaterally; normal respiratory effort CV:         Regular rate and rhythm; no murmurs Abd:      + bowel sounds; soft, non-tender; no palpable masses, no distension Ext:    No edema; adequate peripheral perfusion Skin:      Warm and dry; no rash Neuro: alert and oriented x 3 Psych: normal mood and affect  Data Reviewed:   Reviewed chart in epic   Assessment and Plan/Recommendations:  60 year old male with history of CAD status post CABG on aspirin 325 mg here for evaluation of heme-positive stool We'll schedule for EGD and colonoscopy Try to obtain the records of prior colonoscopy from [facility] to review He had low hemoglobin of 10 in 2010, will recheck CBC, iron panel, ferritin, B12 and folate Will consider small bowel video capsule if EGD and colonoscopy unrevealing for any source of GI blood loss and patient continues to have persistent anemia  [name] , Mcdonald [phone] Mon-Fri 8a-5p [phone] after 5p, weekends, holidays  CC: Mario Clan, Mcdonald

## 2016-03-02 NOTE — Patient Instructions (Addendum)
You have been scheduled for an endoscopy and colonoscopy. Please follow the written instructions given to you at your visit today. Please pick up your prep supplies at the pharmacy within the next 1-3 days. If you use inhalers (even only as needed), please bring them with you on the day of your procedure. Your physician has requested that you go to www.startemmi.com and enter the access code given to you at your visit today. This web site gives a general overview about your procedure. However, you should still follow specific instructions given to you by our office regarding your preparation for the procedure.  Follow up as needed  No oral diabetic meds the morning of your procedure

## 2016-03-06 ENCOUNTER — Telehealth: Payer: Self-pay | Admitting: Gastroenterology

## 2016-03-06 NOTE — Telephone Encounter (Signed)
Patient calling back regarding this. He is wanting to know if we have a coupon for prep. His procedure is scheduled for tomorrow.

## 2016-03-06 NOTE — Telephone Encounter (Signed)
Called pt and left message to come pick up MoviPRep kit today

## 2016-03-07 ENCOUNTER — Encounter: Payer: Self-pay | Admitting: Gastroenterology

## 2016-03-07 ENCOUNTER — Ambulatory Visit (AMBULATORY_SURGERY_CENTER): Payer: BLUE CROSS/BLUE SHIELD | Admitting: Gastroenterology

## 2016-03-07 VITALS — BP 124/66 | HR 59 | Temp 97.7°F | Resp 15 | Ht 65.0 in | Wt 169.0 lb

## 2016-03-07 DIAGNOSIS — R195 Other fecal abnormalities: Secondary | ICD-10-CM

## 2016-03-07 DIAGNOSIS — K2951 Unspecified chronic gastritis with bleeding: Secondary | ICD-10-CM | POA: Diagnosis not present

## 2016-03-07 DIAGNOSIS — D500 Iron deficiency anemia secondary to blood loss (chronic): Secondary | ICD-10-CM

## 2016-03-07 DIAGNOSIS — K319 Disease of stomach and duodenum, unspecified: Secondary | ICD-10-CM

## 2016-03-07 LAB — GLUCOSE, CAPILLARY: GLUCOSE-CAPILLARY: 90 mg/dL (ref 65–99)

## 2016-03-07 NOTE — Progress Notes (Signed)
Called to room to assist during endoscopic procedure.  Patient ID and intended procedure confirmed with present staff. Received instructions for my participation in the procedure from the performing physician.  

## 2016-03-07 NOTE — Progress Notes (Signed)
Report to PACU, RN, vss, BBS= Clear.  

## 2016-03-07 NOTE — Op Note (Signed)
[facility] Patient Name: [name] Procedure Date: 03/07/2016 2:04 PM MRN: [id number] Endoscopist: Napoleon Form , MD Age: 60 Referring MD:  Date of Birth: 1956/01/11 Gender: Male Account #: [id number] Procedure:                Colonoscopy Indications:              Evaluation of unexplained GI bleeding Medicines:                Monitored Anesthesia Care Procedure:                Pre-Anesthesia Assessment:                           - Prior to the procedure, a History and Physical                            was performed, and patient medications and                            allergies were reviewed. The patient's tolerance of                            previous anesthesia was also reviewed. The risks                            and benefits of the procedure and the sedation                            options and risks were discussed with the patient.                            All questions were answered, and informed consent                            was obtained. Prior Anticoagulants: The patient has                            taken no previous anticoagulant or antiplatelet                            agents. ASA Grade Assessment: II - A patient with                            mild systemic disease. After reviewing the risks                            and benefits, the patient was deemed in                            satisfactory condition to undergo the procedure.                           After obtaining informed consent, the colonoscope  was passed under direct vision. Throughout the                            procedure, the patient's blood pressure, pulse, and                            oxygen saturations were monitored continuously. The                            Model CF-HQ190L [id number]) scope was introduced                            through the anus and advanced to the the cecum,                            identified by  appendiceal orifice and ileocecal                            valve. The colonoscopy was performed without                            difficulty. The patient tolerated the procedure                            well. The quality of the bowel preparation was                            excellent. The terminal ileum, ileocecal valve,                            appendiceal orifice, and rectum were photographed. Scope In: 2:13:36 PM Scope Out: 2:23:50 PM Scope Withdrawal Time: 0 hours 8 minutes 17 seconds  Total Procedure Duration: 0 hours 10 minutes 14 seconds  Findings:                 The perianal and digital rectal examinations were                            normal.                           Scattered small-mouthed diverticula were found in                            the sigmoid colon, transverse colon and ascending                            colon.                           Non-bleeding internal hemorrhoids were found during                            retroflexion. The hemorrhoids were small.  The exam was otherwise without abnormality. Complications:            No immediate complications. Estimated Blood Loss:     Estimated blood loss: none. Impression:               - Diverticulosis in the sigmoid colon, in the                            transverse colon and in the ascending colon.                           - Non-bleeding internal hemorrhoids.                           - The examination was otherwise normal.                           - No specimens collected. Recommendation:           - Patient has a contact number available for                            emergencies. The signs and symptoms of potential                            delayed complications were discussed with the                            patient. Return to normal activities tomorrow.                            Written discharge instructions were provided to the                            patient.                            - Resume previous diet.                           - Continue present medications.                           - Repeat colonoscopy in 10 years for screening                            purposes.                           - Return to GI clinic PRN. Napoleon Form, MD 03/07/2016 2:29:15 PM This report has been signed electronically.

## 2016-03-07 NOTE — Op Note (Signed)
[facility] Patient Name: [name] Procedure Date: 03/07/2016 2:02 PM MRN: [id number] Endoscopist: Napoleon Form , MD Age: 60 Referring MD:  Date of Birth: 1956/02/01 Gender: Male Account #: [id number] Procedure:                Upper GI endoscopy Indications:              Gastrointestinal bleeding of unknown origin Medicines:                Monitored Anesthesia Care Procedure:                Pre-Anesthesia Assessment:                           - Prior to the procedure, a History and Physical                            was performed, and patient medications and                            allergies were reviewed. The patient's tolerance of                            previous anesthesia was also reviewed. The risks                            and benefits of the procedure and the sedation                            options and risks were discussed with the patient.                            All questions were answered, and informed consent                            was obtained. Prior Anticoagulants: The patient has                            taken no previous anticoagulant or antiplatelet                            agents. ASA Grade Assessment: II - A patient with                            mild systemic disease. After reviewing the risks                            and benefits, the patient was deemed in                            satisfactory condition to undergo the procedure.                           - Prior to the procedure, a History and Physical  was performed, and patient medications and                            allergies were reviewed. The patient's tolerance of                            previous anesthesia was also reviewed. The risks                            and benefits of the procedure and the sedation                            options and risks were discussed with the patient.                            All questions  were answered, and informed consent                            was obtained. Prior Anticoagulants: The patient has                            taken no previous anticoagulant or antiplatelet                            agents. ASA Grade Assessment: II - A patient with                            mild systemic disease. After reviewing the risks                            and benefits, the patient was deemed in                            satisfactory condition to undergo the procedure.                           After obtaining informed consent, the endoscope was                            passed under direct vision. Throughout the                            procedure, the patient's blood pressure, pulse, and                            oxygen saturations were monitored continuously. The                            Model GIF-HQ190 [id number]) scope was introduced                            through the mouth, and advanced to the second part  of duodenum. The upper GI endoscopy was                            accomplished without difficulty. The patient                            tolerated the procedure well. Scope In: Scope Out: Findings:                 The esophagus was normal.                           Patchy mildly erythematous mucosa with stigmata of                            recent bleeding was found in the entire examined                            stomach. Biopsies were taken with a cold forceps                            for Helicobacter pylori testing.                           The examined duodenum was normal. Complications:            No immediate complications. Estimated Blood Loss:     Estimated blood loss was minimal. Impression:               - Normal esophagus.                           - Erythematous mucosa in the stomach. Biopsied.                           - Normal examined duodenum. Recommendation:           - Patient has a contact number available  for                            emergencies. The signs and symptoms of potential                            delayed complications were discussed with the                            patient. Return to normal activities tomorrow.                            Written discharge instructions were provided to the                            patient.                           - Resume previous diet.                           -  Continue present medications.                           - Await pathology results.                           - Use Prilosec (omeprazole) 20 mg PO daily. Napoleon Form, MD 03/07/2016 2:34:57 PM This report has been signed electronically.

## 2016-03-07 NOTE — Patient Instructions (Signed)

## 2016-03-08 ENCOUNTER — Telehealth: Payer: Self-pay

## 2016-03-08 NOTE — Telephone Encounter (Signed)
  Follow up Call-  Call back number 03/07/2016  Post procedure Call Back phone  # [phone]  Permission to leave phone message Yes  Some recent data might be hidden     Patient questions:  Do you have a fever, pain , or abdominal swelling? No. Pain Score  0 *  Have you tolerated food without any problems? No.  Have you been able to return to your normal activities? Yes.    Do you have any questions about your discharge instructions: Diet   No. Medications  No. Follow up visit  No.  Do you have questions or concerns about your Care? No.  Actions: * If pain score is 4 or above: No action needed, pain <4.  No problems per the pt. maw

## 2016-03-10 ENCOUNTER — Encounter: Payer: Self-pay | Admitting: Gastroenterology

## 2016-07-24 ENCOUNTER — Encounter: Payer: Self-pay | Admitting: *Deleted

## 2016-07-27 ENCOUNTER — Encounter: Payer: Self-pay | Admitting: Cardiology

## 2016-07-27 ENCOUNTER — Ambulatory Visit (INDEPENDENT_AMBULATORY_CARE_PROVIDER_SITE_OTHER): Payer: BLUE CROSS/BLUE SHIELD | Admitting: Cardiology

## 2016-07-27 VITALS — BP 122/66 | HR 58 | Ht 65.0 in | Wt 161.0 lb

## 2016-07-27 DIAGNOSIS — E785 Hyperlipidemia, unspecified: Secondary | ICD-10-CM

## 2016-07-27 DIAGNOSIS — I25810 Atherosclerosis of coronary artery bypass graft(s) without angina pectoris: Secondary | ICD-10-CM

## 2016-07-27 NOTE — Patient Instructions (Signed)
Medication Instructions:  Your physician recommends that you continue on your current medications as directed. Please refer to the Current Medication list given to you today.   Labwork: None   Testing/Procedures: None   Follow-Up: Your physician wants you to follow-up in: 1 year with Dr End. (December 2018). You will receive a reminder letter in the mail two months in advance. If you don't receive a letter, please call our office to schedule the follow-up appointment.   .     If you need a refill on your cardiac medications before your next appointment, please call your pharmacy.   

## 2016-07-27 NOTE — Progress Notes (Signed)
Patient ID: [name], male   DOB: [birth year], 60 y.o.   MRN: [id number] PCP: Dr. Brigitte Pulse  61 yo with h/o hyperlipidemia and CAD s/p CABG returns for evaluation.  In general, he has done quite well since his operation.  No exertional chest pain or shortness of breath. Weight down 13 lbs since last appointment.  He has not been using CPAP since he has lost significant weight. He is not snoring and is not sleepy during day.       Labs (7/10): LDL 61, HDL 31 Labs (1/11): TSH normal, K 4.6, creatinine 0.9, LDL 60, HDL 34, TG 65 Labs (3/12): K 5, creatinine 0.8, LFTs normal , LDL 63, HDL 39 Labs (9/12): LDL 75, HDL 89 Labs (1/13): K 4.4, creatinine 0.88, LDL 65, HDL 47, TSH normal Labs (7/13): LDL 69, HDL 41 Labs (4/14): K 4, creatinine 1.0, LDL 73, HDL 38 Labs (6/15): K 4.7, creatinine 0.9, LDL 78, HDL 35  ECG: NSR, lateral T wave inversions.    Allergies:  No Known Drug Allergies  Past Medical History: 1.  CAD:  ETT-myoview 01/27/09: large basal to apical anterior and anteroseptal reversible defect.  ECG also positive for ischemia.  LHC (6/10) showed EF 65%, totally occluded LAD with collaterals from RCA, 30-40% proximal CFX stenosis.  Patient had CABG by Dr. Servando Snare: LIMA-LAD, SVG-D2.  ETT (3/12): 7'51", borderline 1 mm ST depression in V3/V4 at peak exercise with immediate resolution with rest, no chest pain, overall thought to be low risk.  Stress myoview (1/13) at [facility] was normal.  2.  Hyperlipidemia 3.  Type II diabetes: diet-controlled 4.  GERD 5.  Echo (6/10): EF 60-65%, No AS/AI, no MR, normal RV.  6. 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 Originally from [place] in [place].  Married with 1 son. Nonsmoker, no ETOH.   Current Outpatient Prescriptions  Medication Sig Dispense Refill   . ALPHA LIPOIC ACID PO Take 200 mg by mouth daily.    . Ascorbic Acid (VITAMIN C) 1000 MG tablet Take 1,000 mg by mouth daily.      Marland Kitchen aspirin EC 81 MG tablet Take 81 mg by mouth daily.    Marland Kitchen atorvastatin (LIPITOR) 80 MG tablet Take 1 tablet (80 mg total) by mouth daily. 30 tablet 12  . cholecalciferol (VITAMIN D) 1000 UNITS tablet Take 1,000 Units by mouth daily.      . Coenzyme Q10 (CO Q 10 PO) Take 1 capsule by mouth daily.    Marland Kitchen FREESTYLE LITE test strip USE TO TEST BLOOD SUGARS TWICE A DAY 200 each 0  . lisinopril (PRINIVIL,ZESTRIL) 2.5 MG tablet TAKE ONE TABLET BY MOUTH TWICE DAILY 180 tablet 2  . metformin (FORTAMET) 500 MG (OSM) 24 hr tablet Take 500 mg by mouth daily with breakfast.     . metoprolol tartrate (LOPRESSOR) 25 MG tablet TAKE ONE-HALF TABLET BY MOUTH TWICE DAILY 90 tablet 0  . Multiple Vitamin (MULTIVITAMINS PO) Take 1 tablet by mouth daily. CENTRUM SILVER    . Na Sulfate-K Sulfate-Mg Sulf (SUPREP BOWEL PREP KIT) 17.5-3.13-1.6 GM/180ML SOLN Take 1 kit by mouth once. 1 Bottle 0  . nitroGLYCERIN (NITROSTAT) 0.4 MG SL tablet Place 1 tablet (0.4 mg total) under the tongue every 5 (five) minutes as needed for chest pain. 25 tablet 0  No current facility-administered medications for this visit.    BP 122/66   Pulse (!) 58   Ht '5\' 5"'  (1.651 m)   Wt 161 lb (73 kg)   BMI 26.79 kg/m  General: NAD Neck: No JVD, no thyromegaly or thyroid nodule.  Lungs: Clear to auscultation bilaterally with normal respiratory effort. CV: Nondisplaced PMI.  Heart regular S1/S2, no S3/S4, no murmur.  No peripheral edema.  No carotid bruit.  Normal pedal pulses.  Abdomen: Soft, nontender, no hepatosplenomegaly, no distention.  Neurologic: Alert and oriented x 3.  Psych: Normal affect. Extremities: No clubbing or cyanosis.  Assessment/Plan: 1. CAD: Stable status post CABG.  Good exercise tolerance.  Continue ASA, atorvastatin, lisinopril.   2. Hyperlipidemia: Will call PCP for most recent lipid  profile.   He will followup in 1 year with Dr. Saunders Revel given my transition to CHF clinic.   Loralie Champagne 07/27/2016

## 2016-08-29 DIAGNOSIS — I10 Essential (primary) hypertension: Secondary | ICD-10-CM | POA: Diagnosis not present

## 2016-08-29 DIAGNOSIS — E784 Other hyperlipidemia: Secondary | ICD-10-CM | POA: Diagnosis not present

## 2016-08-29 DIAGNOSIS — J069 Acute upper respiratory infection, unspecified: Secondary | ICD-10-CM | POA: Diagnosis not present

## 2016-08-29 DIAGNOSIS — E119 Type 2 diabetes mellitus without complications: Secondary | ICD-10-CM | POA: Diagnosis not present

## 2016-10-19 ENCOUNTER — Encounter: Payer: Self-pay | Admitting: Internal Medicine

## 2016-10-20 ENCOUNTER — Ambulatory Visit (INDEPENDENT_AMBULATORY_CARE_PROVIDER_SITE_OTHER): Payer: BLUE CROSS/BLUE SHIELD | Admitting: Internal Medicine

## 2016-10-20 ENCOUNTER — Encounter: Payer: Self-pay | Admitting: Internal Medicine

## 2016-10-20 VITALS — BP 116/70 | HR 61 | Ht 65.0 in | Wt 159.0 lb

## 2016-10-20 DIAGNOSIS — E785 Hyperlipidemia, unspecified: Secondary | ICD-10-CM | POA: Diagnosis not present

## 2016-10-20 DIAGNOSIS — I25118 Atherosclerotic heart disease of native coronary artery with other forms of angina pectoris: Secondary | ICD-10-CM | POA: Diagnosis not present

## 2016-10-20 NOTE — Patient Instructions (Signed)
Medication Instructions:  Your physician recommends that you continue on your current medications as directed. Please refer to the Current Medication list given to you today.    Labwork: None   Testing/Procedures: Your physician has requested that you have en exercise stress myoview. For further information please visit [email or web address]. Please follow instruction sheet, as given.    Follow-Up: Your physician recommends that you schedule a follow-up appointment in: 3 months with Dr End.        If you need a refill on your cardiac medications before your next appointment, please call your pharmacy.   

## 2016-10-20 NOTE — Progress Notes (Signed)
Patient ID: [name], male   DOB: [birth year], 61 y.o.   MRN: [id number] PCP: Dr. Brigitte Pulse  61 yo with h/o hyperlipidemia and CAD s/p CABG returns for evaluation.  He was last seen by Dr. Aundra Dubin in 07/2016. He had been doing well up until a few weeks ago when he began experiencing a vague discomfort in his chest. This began after he was moving into a new home. He is not sure if it was related to lifting heavy boxes, though the discomfort has persisted. He is not able to characterize it further and denies associated symptoms such as shortness of breath and lightheadedness. He has also felt a little bit more fatigued at times. He denies dyspnea, orthopnea, PND, and edema. He is no longer using CPAP because he feels like it was not helping. He notes that he was switched from atorvastatin to rosuvastatin by his PCP a few months ago and wonders if this was a reasonable medication change. He is planning to travel to [place] later this month for meetings.    Labs (7/10): LDL 61, HDL 31 Labs (1/11): TSH normal, K 4.6, creatinine 0.9, LDL 60, HDL 34, TG 65 Labs (3/12): K 5, creatinine 0.8, LFTs normal , LDL 63, HDL 39 Labs (9/12): LDL 75, HDL 89 Labs (1/13): K 4.4, creatinine 0.88, LDL 65, HDL 47, TSH normal Labs (7/13): LDL 69, HDL 41 Labs (4/14): K 4, creatinine 1.0, LDL 73, HDL 38 Labs (6/15): K 4.7, creatinine 0.9, LDL 78, HDL 35  ECG: Normal sinus rhythm without significant abnormalities.  Allergies:  No Known Drug Allergies  Past Medical History: 1.  CAD:  ETT-myoview 01/27/09: large basal to apical anterior and anteroseptal reversible defect.  ECG also positive for ischemia.  LHC (6/10) showed EF 65%, totally occluded LAD with collaterals from RCA, 30-40% proximal CFX stenosis.  Patient had CABG by Dr. Servando Snare: LIMA-LAD, SVG-D2.  ETT (3/12): 7'51", borderline 1 mm ST depression in V3/V4 at peak exercise with immediate resolution with rest, no chest pain, overall thought to be low risk.  Stress  my[facility]iew (1/13) at [facility] was normal.  2.  Hyperlipidemia 3.  Type II diabetes: diet-controlled 4.  GERD 5.  Echo (6/10): EF 60-65%, No AS/AI, no MR, normal RV.  6. 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 Originally from [place] in [place].  Married with 1 son. Nonsmoker, no ETOH.   Current Outpatient Prescriptions  Medication Sig Dispense Refill  . ALPHA LIPOIC ACID PO Take 200 mg by mouth daily.    . Ascorbic Acid (VITAMIN C) 1000 MG tablet Take 1,000 mg by mouth daily.      Marland Kitchen aspirin EC 81 MG tablet Take 81 mg by mouth daily.    Marland Kitchen atorvastatin (LIPITOR) 80 MG tablet Take 80 mg by mouth daily.    . cholecalciferol (VITAMIN D) 1000 UNITS tablet Take 1,000 Units by mouth daily.      . Coenzyme Q10 (CO Q 10 PO) Take 1 capsule by mouth daily.    Marland Kitchen FREESTYLE LITE test strip USE TO TEST BLOOD SUGARS TWICE A DAY 200 each 0  . lisinopril (PRINIVIL,ZESTRIL) 2.5 MG tablet TAKE ONE TABLET BY MOUTH TWICE DAILY 180 tablet 2  . metformin (FORTAMET) 500 MG (OSM) 24 hr tablet Take 500 mg by mouth daily with  breakfast.     . metoprolol tartrate (LOPRESSOR) 25 MG tablet TAKE ONE-HALF TABLET BY MOUTH TWICE DAILY 90 tablet 0  . Multiple Vitamin (MULTIVITAMINS PO) Take 1 tablet by mouth daily. CENTRUM SILVER    . Na Sulfate-K Sulfate-Mg Sulf (SUPREP BOWEL PREP KIT) 17.5-3.13-1.6 GM/180ML SOLN Take 1 kit by mouth once. 1 Bottle 0  . nitroGLYCERIN (NITROSTAT) 0.4 MG SL tablet Place 1 tablet (0.4 mg total) under the tongue every 5 (five) minutes as needed for chest pain. 25 tablet 0  . rosuvastatin (CRESTOR) 40 MG tablet Take 40 mg by mouth daily.  3   No current facility-administered medications for this visit.    BP 116/70 (BP Location: Left Arm, Patient Position: Sitting, Cuff Size: Normal)   Pulse 61   Ht '5\' 5"'   (1.651 m)   Wt 159 lb (72.1 kg)   SpO2 97%   BMI 26.46 kg/m  General: Well-developed, well-nourished man, seated comfortably in the exam room. Neck: Supple, without lymphadenopathy, thyromegaly, JVD, or HJR. Lungs: Clear to auscultation bilaterally with normal respiratory effort. CV: Regular rate and rhythm without murmurs, rubs, or gallops. Nondisplaced PMI. Well-healed median sternotomy incision. Abdomen: Bowel sounds present. Soft, nontender, nondistended without hepatosplenomegaly Extremities: No clubbing or cyanosis. No lower extremity edema. 2+ radial, posterior tibial, dorsalis pedis pulses bilaterally..  Assessment/Plan: 1. CAD status post CABG: Patient has been without true angina but reports a vague discomfort or uneasy sensation in his chest over the last few weeks. He continues to walk without difficulty. His EKG today is normal. However, given his history of CAD and CABG, we have discussed further evaluation options and have agreed to perform a treadmill myocardial perfusion stress test prior to his upcoming trip to [place]. We will continue his current medication regimen for secondary prevention.  2. Hyperlipidemia: Patient recently switched from atorvastatin 80 mg daily to rosuvastatin 40 mg daily by his PCP. I spoke with patient at length regarding rationale for high intensity statin therapy in that both of these agents are recommended for secondary prevention in patients like himself. We have agreed to continue with rosuvastatin 40 mg daily, with a target LDL less than 70. I will defer further management to Dr. Brigitte Pulse.  Follow-up: Return to clinic in 3 months.  Blanche Scovell 10/20/2016

## 2016-10-21 ENCOUNTER — Encounter: Payer: Self-pay | Admitting: Internal Medicine

## 2016-10-23 ENCOUNTER — Telehealth (HOSPITAL_COMMUNITY): Payer: Self-pay | Admitting: *Deleted

## 2016-10-23 NOTE — Telephone Encounter (Signed)
Patient given detailed instructions per Myocardial Perfusion Study Information Sheet for the test on 10/25/16 at 0715. Patient notified to arrive 15 minutes early and that it is imperative to arrive on time for appointment to keep from having the test rescheduled.  If you need to cancel or reschedule your appointment, please call the office within 24 hours of your appointment. Failure to do so may result in a cancellation of your appointment, and a $50 no show fee. Patient verbalized understanding.Jaggar Benko, Adelene IdlerCynthia W

## 2016-10-24 ENCOUNTER — Telehealth (HOSPITAL_COMMUNITY): Payer: Self-pay | Admitting: Radiology

## 2016-10-24 NOTE — Telephone Encounter (Signed)
Patient given detailed instructions per Myocardial Perfusion Study Information Sheet for the test on 10/25/2016 at 7:15. Patient notified to arrive 15 minutes early and that it is imperative to arrive on time for appointment to keep from having the test rescheduled.  If you need to cancel or reschedule your appointment, please call the office within 24 hours of your appointment. Failure to do so may result in a cancellation of your appointment, and a $50 no show fee. Patient verbalized understanding.EHK

## 2016-10-25 ENCOUNTER — Ambulatory Visit (HOSPITAL_COMMUNITY): Payer: BLUE CROSS/BLUE SHIELD | Attending: Cardiovascular Disease

## 2016-10-25 DIAGNOSIS — R5383 Other fatigue: Secondary | ICD-10-CM | POA: Diagnosis not present

## 2016-10-25 DIAGNOSIS — E119 Type 2 diabetes mellitus without complications: Secondary | ICD-10-CM | POA: Diagnosis not present

## 2016-10-25 DIAGNOSIS — I10 Essential (primary) hypertension: Secondary | ICD-10-CM | POA: Insufficient documentation

## 2016-10-25 DIAGNOSIS — R079 Chest pain, unspecified: Secondary | ICD-10-CM | POA: Insufficient documentation

## 2016-10-25 DIAGNOSIS — Z951 Presence of aortocoronary bypass graft: Secondary | ICD-10-CM | POA: Insufficient documentation

## 2016-10-25 DIAGNOSIS — I2510 Atherosclerotic heart disease of native coronary artery without angina pectoris: Secondary | ICD-10-CM | POA: Diagnosis not present

## 2016-10-25 DIAGNOSIS — Z8249 Family history of ischemic heart disease and other diseases of the circulatory system: Secondary | ICD-10-CM | POA: Diagnosis not present

## 2016-10-25 DIAGNOSIS — I25118 Atherosclerotic heart disease of native coronary artery with other forms of angina pectoris: Secondary | ICD-10-CM | POA: Diagnosis not present

## 2016-10-25 LAB — MYOCARDIAL PERFUSION IMAGING
CHL CUP MPHR: 161 {beats}/min
CHL CUP RESTING HR STRESS: 62 {beats}/min
CSEPED: 5 min
Estimated workload: 7 METS
LVDIAVOL: 82 mL (ref 62–150)
LVSYSVOL: 24 mL
NUC STRESS TID: 0.99
Peak HR: 160 {beats}/min
Percent HR: 99 %
RATE: 0.35
RPE: 18
SDS: 1
SRS: 9
SSS: 10

## 2016-10-25 MED ORDER — TECHNETIUM TC 99M TETROFOSMIN IV KIT
10.2000 | PACK | Freq: Once | INTRAVENOUS | Status: AC | PRN
  Administered 2016-10-25: 10.2 via INTRAVENOUS
  Filled 2016-10-25: qty 11

## 2016-10-25 MED ORDER — TECHNETIUM TC 99M TETROFOSMIN IV KIT
32.5000 | PACK | Freq: Once | INTRAVENOUS | Status: AC | PRN
  Administered 2016-10-25: 32.5 via INTRAVENOUS
  Filled 2016-10-25: qty 33

## 2017-02-02 ENCOUNTER — Ambulatory Visit: Payer: BLUE CROSS/BLUE SHIELD | Admitting: Internal Medicine

## 2017-07-15 IMAGING — NM NM MISC PROCEDURE
6 series · 36 of 36 positions shown · non-contrast
Comparison: none

[Series 1: rest · 6.51mm/px · 6 of 64 frames shown]
[frame 6/64]
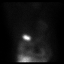
[frame 16/64]
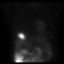
[frame 27/64]
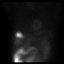
[frame 38/64]
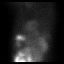
[frame 48/64]
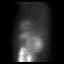
[frame 59/64]
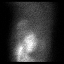

[Series 1: wbr_r-proj_st rest · 6.51mm/px · 6 of 64 frames shown]
[frame 6/64]
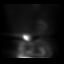
[frame 16/64]
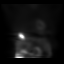
[frame 27/64]
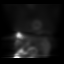
[frame 38/64]
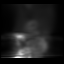
[frame 48/64]
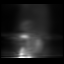
[frame 59/64]
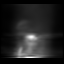

[Series 2: stress · 6.51mm/px · 6 of 64 frames shown (1 of 2)]
[frame 6/64]
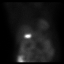
[frame 16/64]
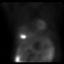
[frame 27/64]
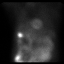
[frame 38/64]
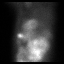
[frame 48/64]
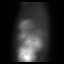
[frame 59/64]
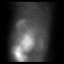

[Series 2: stress · 6.51mm/px · 6 of 512 frames shown (2 of 2)]
[frame 43/512]
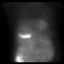
[frame 128/512]
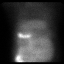
[frame 214/512]
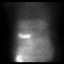
[frame 299/512]
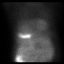
[frame 384/512]
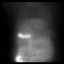
[frame 470/512]
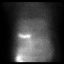

[Series 2: wbr_s-proj_st stress · 6.51mm/px · 6 of 512 frames shown (1 of 2)]
[frame 43/512]
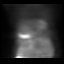
[frame 128/512]
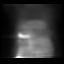
[frame 214/512]
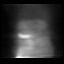
[frame 299/512]
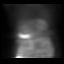
[frame 384/512]
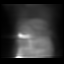
[frame 470/512]
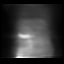

[Series 2: wbr_s-proj_st stress · 6.51mm/px · 6 of 64 frames shown (2 of 2)]
[frame 6/64]
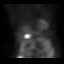
[frame 16/64]
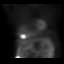
[frame 27/64]
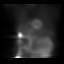
[frame 38/64]
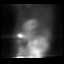
[frame 48/64]
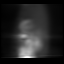
[frame 59/64]
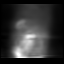

[36 of 36 positions shown; findings below may reference images not displayed]

Canned report from images found in remote index.

Refer to host system for actual result text.

## 2017-09-04 DIAGNOSIS — S61449A Puncture wound with foreign body of unspecified hand, initial encounter: Secondary | ICD-10-CM | POA: Diagnosis not present

## 2017-09-05 MED ORDER — RALTEGRAVIR 400 MG TABLET
ORAL_TABLET | 0 refills | 0 days
Start: 2017-09-05 — End: 2018-09-05

## 2017-09-05 MED ORDER — EMTRICITABINE 200 MG-TENOFOVIR ALAFENAMIDE FUMARATE 25 MG TABLET
ORAL_TABLET | ORAL | 0 refills | 0 days
Start: 2017-09-05 — End: 2018-09-05

## 2017-09-05 NOTE — Unmapped (Signed)
[facility]   Patient Onboarding/Medication Counseling    Mr.Gamino is a 62 y.o. male with accidental needle stick and preventative treatment who I am counseling today on initiation of therapy.    Medication: Descovy and Isentress    Verified patient's date of birth / HIPAA.      Education Provided: ??    Dose/Administration discussed: Descovy: Take 1 tablet by mouth once daily. Isentress: Take 1 tablet by mouth twice daily. This medication should be taken  without regard to food.     Storage requirements: this medicine should be stored at room temperature.     Side effects discussed: Discussed common side effects, including upset stomach (with both medications) Not able to sleep, Headache, or Dizziness with Isentress.   . If patient experiences allergic reaction (hives, redness, itching, difficulty breathing, swollen red or blistering/peeling skin) liver problems (darkening of urine, yellowing of skin or eyes), they need to call the doctor.  Patient will receive a Lexi-Comp drug information handout with shipment.    Handling precautions reviewed:  n/a.    Drug Interactions: other medications reviewed and up to date in Epic.  No drug interactions identified.    Comorbidities/Allergies: reviewed and up to date in Epic.    Verified therapy is appropriate and should continue      Delivery Information    Anticipated copay of $0 reviewed with patient. Verified delivery address in [facility]SI and reviewed medication storage requirement.    Scheduled delivery date: Patient is picking up from [facility] on 1/24 around 10:30am.    Explained that we ship using UPS and this shipment will not require a signature.      Explained the services we provide at [facility] and that each month we would call to set up refills.  Stressed importance of returning phone calls so that we could ensure they receive their medications in time each month.  Informed patient that we should be setting up refills 7-10 days prior to when they will run out of medication.  Informed patient that welcome packet will be sent.      Patient verbalized understanding of the above information as well as how to contact the pharmacy at [phone] option [phone] with any questions/concerns.        Patient Specific Needs      ? Patient has no physical or cognitive barriers.    ? Patient prefers to have medications discussed with  Patient     ? Patient is able to read and understand education materials at a high school level or above.        Breck Coons [facility] Specialty Pharmacist

## 2017-09-05 NOTE — Unmapped (Signed)
Per test claim for Descovy and Isentress at the [facility], patient needs Medication Assistance Program for High Copay.

## 2017-09-05 NOTE — Unmapped (Signed)
Patient has been approved for financial assistance for Isentress and Descovy for $0

## 2017-09-17 DIAGNOSIS — E119 Type 2 diabetes mellitus without complications: Secondary | ICD-10-CM | POA: Diagnosis not present

## 2017-09-17 DIAGNOSIS — R82998 Other abnormal findings in urine: Secondary | ICD-10-CM | POA: Diagnosis not present

## 2017-09-17 DIAGNOSIS — I10 Essential (primary) hypertension: Secondary | ICD-10-CM | POA: Diagnosis not present

## 2017-09-17 DIAGNOSIS — E7849 Other hyperlipidemia: Secondary | ICD-10-CM | POA: Diagnosis not present

## 2017-09-17 DIAGNOSIS — Z125 Encounter for screening for malignant neoplasm of prostate: Secondary | ICD-10-CM | POA: Diagnosis not present

## 2017-09-19 DIAGNOSIS — Z1212 Encounter for screening for malignant neoplasm of rectum: Secondary | ICD-10-CM | POA: Diagnosis not present

## 2017-09-24 NOTE — Unmapped (Signed)
Patient taking Isentress and Descovy short term for needle stick [place] - left messages to confirm tx completed, but not response from patient. No further specialty pharmacy outreach calls necessary.

## 2017-09-27 DIAGNOSIS — W461XXD Contact with contaminated hypodermic needle, subsequent encounter: Secondary | ICD-10-CM | POA: Diagnosis not present

## 2017-09-27 DIAGNOSIS — I2510 Atherosclerotic heart disease of native coronary artery without angina pectoris: Secondary | ICD-10-CM | POA: Diagnosis not present

## 2017-09-27 DIAGNOSIS — E7849 Other hyperlipidemia: Secondary | ICD-10-CM | POA: Diagnosis not present

## 2017-09-27 DIAGNOSIS — E119 Type 2 diabetes mellitus without complications: Secondary | ICD-10-CM | POA: Diagnosis not present

## 2017-09-27 DIAGNOSIS — Z0000 Encounter for general adult medical examination without abnormal findings: Secondary | ICD-10-CM | POA: Diagnosis not present

## 2017-09-27 DIAGNOSIS — I10 Essential (primary) hypertension: Secondary | ICD-10-CM | POA: Diagnosis not present

## 2017-11-20 DIAGNOSIS — S61449D Puncture wound with foreign body of unspecified hand, subsequent encounter: Secondary | ICD-10-CM | POA: Diagnosis not present

## 2017-11-23 ENCOUNTER — Encounter: Payer: Self-pay | Admitting: Internal Medicine

## 2017-11-29 ENCOUNTER — Encounter: Payer: Self-pay | Admitting: Internal Medicine

## 2017-11-29 ENCOUNTER — Ambulatory Visit: Payer: BLUE CROSS/BLUE SHIELD | Admitting: Internal Medicine

## 2017-11-29 VITALS — BP 112/70 | HR 59 | Ht 65.0 in | Wt 172.4 lb

## 2017-11-29 DIAGNOSIS — E785 Hyperlipidemia, unspecified: Secondary | ICD-10-CM

## 2017-11-29 DIAGNOSIS — I2510 Atherosclerotic heart disease of native coronary artery without angina pectoris: Secondary | ICD-10-CM | POA: Diagnosis not present

## 2017-11-29 MED ORDER — EZETIMIBE 10 MG PO TABS: 10.0000 mg | ORAL_TABLET | Freq: Every day | ORAL | 3 refills | Status: DC

## 2017-11-29 NOTE — Progress Notes (Signed)
Follow-up Outpatient Visit Date: 11/29/2017  Primary Care Provider: Marton Redwood, MD [place] [address]  Chief Complaint: Follow-up coronary artery disease  HPI:  Mr. Blank is a 62 y.o. 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  I last saw him in 10/2016, at which time he was doing well.  Today, Mr. Blatt continues to feel well.  He denies chest pain, shortness of breath, palpitations, lightheadedness, and edema.  He is traveling a lot and has noted some weight gain associated with this.  He does not exercise regularly but tends to walk quite a bit when he is traveling.  He would like to get his weight down to around 155 pounds.  Due to suboptimal LDL, Mr. Vences was started on ezetimibe about a month ago by Dr. Brigitte Pulse.  He is tolerating this well.  Mr. Basnett had an accidental needlestick while traveling in [place].  Dr. Brigitte Pulse has been evaluating this and started him on prophylactic antiretroviral therapy.  However, Mr. Normoyle was only able to take this for 2 days due to side effects.  Fortunately, it sounds like his HIV and hepatitis studies have come back negative thus far.  --------------------------------------------------------------------------------------------------  Past Medical/Surgical History: 1.  CAD:  ETT-myoview 01/27/09: large basal to apical anterior and anteroseptal reversible defect.  ECG also positive for ischemia.  LHC (6/10) showed EF 65%, totally occluded LAD with collaterals from RCA, 30-40% proximal CFX stenosis.  Patient had CABG by Dr. Servando Snare: LIMA-LAD, SVG-D2.  ETT (3/12): 7'51", borderline 1 mm ST depression in V3/V4 at peak exercise with immediate resolution with rest, no chest pain, overall thought to be low risk.  Stress myoview (1/13) at [facility] was normal.  2.   Hyperlipidemia 3.  Type II diabetes: diet-controlled 4.  GERD 5.  Echo (6/10): EF 60-65%, No AS/AI, no MR, normal RV.  6. OSA: CPAP  Current Meds  Medication Sig  . ALPHA LIPOIC ACID PO Take 200 mg by mouth daily.  . Ascorbic Acid (VITAMIN C) 1000 MG tablet Take 1,000 mg by mouth daily.    Marland Kitchen aspirin EC 81 MG tablet Take 81 mg by mouth daily.  . cholecalciferol (VITAMIN D) 1000 UNITS tablet Take 1,000 Units by mouth daily.    . Coenzyme Q10 (CO Q 10 PO) Take 1 capsule by mouth daily.  Marland Kitchen ezetimibe (ZETIA) 10 MG tablet Take 10 mg by mouth daily.  Marland Kitchen FREESTYLE LITE test strip USE TO TEST BLOOD SUGARS TWICE A DAY  . lisinopril (PRINIVIL,ZESTRIL) 2.5 MG tablet TAKE ONE TABLET BY MOUTH TWICE DAILY  . metformin (FORTAMET) 500 MG (OSM) 24 hr tablet Take 500 mg by mouth daily with breakfast.   . metoprolol tartrate (LOPRESSOR) 25 MG tablet TAKE ONE-HALF TABLET BY MOUTH TWICE DAILY  . Multiple Vitamin (MULTIVITAMINS PO) Take 1 tablet by mouth daily. CENTRUM SILVER  . Na Sulfate-K Sulfate-Mg Sulf (SUPREP BOWEL PREP KIT) 17.5-3.13-1.6 GM/180ML SOLN Take 1 kit by mouth once.  . nitroGLYCERIN (NITROSTAT) 0.4 MG SL tablet Place 1 tablet (0.4 mg total) under the tongue every 5 (five) minutes as needed for chest pain.  . rosuvastatin (CRESTOR) 40 MG tablet Take 40 mg by mouth daily.    Allergies: Patient has no known allergies.  Social History   Socioeconomic History  . Marital status: Married    Spouse  name: Not on file  . Number of children: 1  . Years of education: college  . Highest education level: Not on file  Occupational History  . Occupation: [employer]  Social Needs  . Financial resource strain: Not on file  . Food insecurity:    Worry: Not on file    Inability: Not on file  . Transportation needs:    Medical: Not on file    Non-medical: Not on file  Tobacco Use  . Smoking status: Never Smoker  . Smokeless tobacco: Never Used  Substance and Sexual Activity  . Alcohol use:  No  . Drug use: No  . Sexual activity: Not on file  Lifestyle  . Physical activity:    Days per week: Not on file    Minutes per session: Not on file  . Stress: Not on file  Relationships  . Social connections:    Talks on phone: Not on file    Gets together: Not on file    Attends religious service: Not on file    Active member of club or organization: Not on file    Attends meetings of clubs or organizations: Not on file    Relationship status: Not on file  . Intimate partner violence:    Fear of current or ex partner: Not on file    Emotionally abused: Not on file    Physically abused: Not on file    Forced sexual activity: Not on file  Other Topics Concern  . Not on file  Social History Narrative   Foreign travel. Originally from [place] in [place].      Family History  Problem Relation Age of Onset  . Stroke Mother 51  . Suicidality Father 44  . Heart attack Brother 7  . Diabetes Brother   . High Cholesterol Brother   . Diabetes Sister   . High Cholesterol Sister     Review of Systems: A 12-system review of systems was performed and was negative except as noted in the HPI.  --------------------------------------------------------------------------------------------------  Physical Exam: BP 112/70   Pulse (!) 59   Ht '5\' 5"'  (1.651 m)   Wt 172 lb 6.4 oz (78.2 kg)   BMI 28.69 kg/m   General:  NAD. HEENT: No conjunctival pallor or scleral icterus. Moist mucous membranes.  OP clear. Neck: Supple without lymphadenopathy, thyromegaly, JVD, or HJR. Lungs: Normal work of breathing. Clear to auscultation bilaterally without wheezes or crackles. Heart: Regular rate and rhythm without murmurs, rubs, or gallops. Non-displaced PMI. Abd: Bowel sounds present. Soft, NT/ND without hepatosplenomegaly Ext: No lower extremity edema. Radial, PT, and DP pulses are 2+ bilaterally. Skin: Warm and dry without rash.  EKG:  Sinus bradycardia with lateral T-wave inversions,  slightly more pronounced than in November 13, 2016 but similar to 07/2016.  Lab Results  Component Value Date   WBC 5.7 03/02/2016   HGB 15.5 03/02/2016   HCT 46.8 03/02/2016   MCV 80.5 03/02/2016   PLT 276.0 03/02/2016    Lab Results  Component Value Date   NA 138 10/16/2013   K 4.1 10/16/2013   CL 104 10/16/2013   CO2 27 10/16/2013   BUN 12 10/16/2013   CREATININE 0.9 10/16/2013   GLUCOSE 95 10/16/2013   ALT 24 10/16/2013    Lab Results  Component Value Date   CHOL 113 10/16/2013   HDL 37.60 (L) 10/16/2013   LDLCALC 63 10/16/2013   TRIG 62.0 10/16/2013   CHOLHDL 3 10/16/2013   Outside labs (  09/2017): Total cholesterol 138, HDL 37, LDL 82, triglycerides 86, hemoglobin A1c 6.8, creatinine 0.9, ALT 25  --------------------------------------------------------------------------------------------------  ASSESSMENT AND PLAN: Coronary artery disease without angina Mr. Nicki Reaper continues to do well without chest pain or shortness of breath.  I have encouraged him to continue his current medications for secondary prevention, including aspirin 81 mg daily, metoprolol tartrate 12.5 mg twice daily, rosuvastatin 40 mg daily, and ezetimibe 10 mg daily.  Increase his activity, as tolerated, with the hope of losing ~10 pounds before our next visit in a year.  Hyperlipidemia LDL suboptimally controlled when last checked by Dr. Brigitte Pulse in February (LDL 82).  I agree with addition of ezetimibe, which we will renew today.  Follow-up lipid panel should be performed in another month or two to assess response; I will defer this to Dr. Brigitte Pulse.  If LDL remains above 70, addition of a PCSK9 inhibitor will need to be considered.  Follow-up: Return to clinic in 1 year.  Nelva Bush, MD 11/29/2017 8:35 AM

## 2017-11-29 NOTE — Patient Instructions (Addendum)
Medication Instructions:  Your physician recommends that you continue on your current medications as directed. Please refer to the Current Medication list given to you today.  -- If you need a refill on your cardiac medications before your next appointment, please call your pharmacy. --  Labwork: None ordered  Testing/Procedures: None ordered  Follow-Up: Your physician wants you to follow-up in: 1 year with Dr. End.    You will receive a reminder letter in the mail two months in advance. If you don't receive a letter, please call our office to schedule the follow-up appointment.  Thank you for choosing [facility]!!    Any Other Special Instructions Will Be Listed Below (If Applicable).         

## 2018-03-27 DIAGNOSIS — E119 Type 2 diabetes mellitus without complications: Secondary | ICD-10-CM | POA: Diagnosis not present

## 2018-03-27 DIAGNOSIS — I2510 Atherosclerotic heart disease of native coronary artery without angina pectoris: Secondary | ICD-10-CM | POA: Diagnosis not present

## 2018-03-27 DIAGNOSIS — I10 Essential (primary) hypertension: Secondary | ICD-10-CM | POA: Diagnosis not present

## 2018-04-18 ENCOUNTER — Telehealth: Payer: Self-pay | Admitting: Internal Medicine

## 2018-04-18 NOTE — Telephone Encounter (Signed)
° ° °  Patient states he has fatigue and sensation in chest area. No pain per patient No SOB No chest pain

## 2018-04-18 NOTE — Telephone Encounter (Signed)
Spoke with patient who is having a "weird" feeling in his chest.  It is not a pain, nor is there SOB.  His BP is within normal range.  He has a f/u appt in the morning 9/6 here at the office.  I told him that if the pain worsens, he should go to the ED.  He verbalized understanding.

## 2018-04-19 ENCOUNTER — Encounter: Payer: Self-pay | Admitting: Cardiology

## 2018-04-19 ENCOUNTER — Ambulatory Visit: Payer: BLUE CROSS/BLUE SHIELD | Admitting: Cardiology

## 2018-04-19 VITALS — BP 116/72 | HR 68 | Ht 65.0 in | Wt 165.0 lb

## 2018-04-19 DIAGNOSIS — E118 Type 2 diabetes mellitus with unspecified complications: Secondary | ICD-10-CM

## 2018-04-19 DIAGNOSIS — G4733 Obstructive sleep apnea (adult) (pediatric): Secondary | ICD-10-CM

## 2018-04-19 DIAGNOSIS — R079 Chest pain, unspecified: Secondary | ICD-10-CM | POA: Diagnosis not present

## 2018-04-19 DIAGNOSIS — E785 Hyperlipidemia, unspecified: Secondary | ICD-10-CM

## 2018-04-19 DIAGNOSIS — I2510 Atherosclerotic heart disease of native coronary artery without angina pectoris: Secondary | ICD-10-CM | POA: Diagnosis not present

## 2018-04-19 DIAGNOSIS — Z794 Long term (current) use of insulin: Secondary | ICD-10-CM

## 2018-04-19 LAB — TROPONIN T

## 2018-04-19 LAB — LIPID PANEL
Chol/HDL Ratio: 2.7 ratio (ref 0.0–5.0)
Cholesterol, Total: 109 mg/dL (ref 100–199)
HDL: 41 mg/dL (ref >39)
LDL Calculated: 56 mg/dL (ref 0–99)
Triglycerides: 58 mg/dL (ref 0–149)
VLDL Cholesterol Cal: 12 mg/dL (ref 5–40)

## 2018-04-19 NOTE — Progress Notes (Signed)
Cardiology Office Note:    Date:  04/19/2018   ID:  [name], DOB Mar 10, 1956, MRN [id number]  PCP:  Marton Redwood, MD  Cardiologist:  Nelva Bush, MD  Referring MD: Marton Redwood, MD   Chief Complaint  Patient presents with  . Chest Pain    History of Present Illness:    [name] is a 62 y.o. male with a past medical history significant for CAD status post CABG (01/2009; LIMA to LAD and SVG to D2), hyperlipidemia, type 2 diabetes mellitus, and OSA.   He was last seen in the office by Dr. Saunders Revel 11/29/2017 at which time he was doing well with no chest pain  He is here today alone. About 2 days ago he experienced an episode of strange feeling in his chest. It occurs at random times, like sitting or in bed about 1-2 times per day. It was not pain. Lasts only a few seconds. Possibly a brief flutter, but he is unable to describe it. He says that the symptoms prior to his needing bypass were not very specific and this new sensation is concerning him.  No associated lightheadedness, nausea, syncope. He sleeps well with no orthopnea or PND. No edema. He notes that he had in the last week changed how he takes his zetia and statin, splitting up the doses instead of taking them together and wondered if that could have caused his symptoms. He notes that this has been similar to an episode about 2 years ago when he had testing that was all normal.   He walks every day sometimes twice a day, at least 30 minutes, 1-2 miles. No exertional symptoms with this.   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   -ETT-myoview 01/27/09: large basal to apical anterior and anteroseptal reversible defect. ECG also positive for ischemia.  -LHC (01/2009) showed EF 65%, totally occluded LAD with collaterals from RCA, 30-40% proximal CFX stenosis. Patient had CABG by Dr. Servando Snare: LIMA-LAD, SVG-D2.  -ETT (10/2010): 7'51", borderline 1 mm ST  depression in V3/V4 at peak exercise with immediate resolution with rest, no chest pain, overall thought to be low risk. -Stress myoview (08/2011) at [facility] was normal.  -Stress myoview 10/25/16: low risk study with no evidence of ischemia and no evidence of infarction.  Past Medical History:  Diagnosis Date  . Coronary artery disease    ETT-myoview 01/27/09: large basal to apical anterior and anteroseptal reversible defect.  ECG also positive  for ischemia.  LHC (6/10) showed EF 65% totally occluded LAD with collaterals from RCA, 30-40% proximal CFX stenosis  . Diabetes mellitus    pre diabetes  . GERD (gastroesophageal reflux disease)   . Hyperlipidemia     Past Surgical History:  Procedure Laterality Date  . DOPPLER ECHOCARDIOGRAPHY     (6/10): EF 60-65%, NO AS/AI, no MR, normal RV.  Marland Kitchen open heart surgery  01/2009    Current Medications: Current Meds  Medication Sig  . ALPHA LIPOIC ACID PO Take 200 mg by mouth daily.  . Ascorbic Acid (VITAMIN C) 1000 MG tablet Take 1,000 mg by mouth daily.    Marland Kitchen aspirin EC 81 MG tablet Take 81 mg by mouth daily.  . cholecalciferol (VITAMIN D) 1000 UNITS tablet Take 1,000 Units by mouth daily.    . Coenzyme Q10 (CO Q 10 PO) Take 1 capsule by mouth daily.  Marland Kitchen  ezetimibe (ZETIA) 10 MG tablet Take 1 tablet (10 mg total) by mouth daily.  Marland Kitchen FREESTYLE LITE test strip USE TO TEST BLOOD SUGARS TWICE A DAY  . lisinopril (PRINIVIL,ZESTRIL) 2.5 MG tablet TAKE ONE TABLET BY MOUTH TWICE DAILY  . metformin (FORTAMET) 500 MG (OSM) 24 hr tablet Take 500 mg by mouth daily with breakfast.   . metoprolol tartrate (LOPRESSOR) 25 MG tablet TAKE ONE-HALF TABLET BY MOUTH TWICE DAILY  . Multiple Vitamin (MULTIVITAMINS PO) Take 1 tablet by mouth daily. CENTRUM SILVER  . Na Sulfate-K Sulfate-Mg Sulf (SUPREP BOWEL PREP KIT) 17.5-3.13-1.6 GM/180ML SOLN Take 1 kit by mouth once.  . nitroGLYCERIN (NITROSTAT) 0.4 MG SL tablet Place 1 tablet (0.4 mg total)  under the tongue every 5 (five) minutes as needed for chest pain.  . rosuvastatin (CRESTOR) 40 MG tablet Take 40 mg by mouth daily.     Allergies:   Patient has no known allergies.   Social History   Socioeconomic History  . Marital status: Married    Spouse name: Not on file  . Number of children: 1  . Years of education: college  . Highest education level: Not on file  Occupational History  . Occupation: [employer]  Social Needs  . Financial resource strain: Not on file  . Food insecurity:    Worry: Not on file    Inability: Not on file  . Transportation needs:    Medical: Not on file    Non-medical: Not on file  Tobacco Use  . Smoking status: Never Smoker  . Smokeless tobacco: Never Used  Substance and Sexual Activity  . Alcohol use: No  . Drug use: No  . Sexual activity: Not on file  Lifestyle  . Physical activity:    Days per week: Not on file    Minutes per session: Not on file  . Stress: Not on file  Relationships  . Social connections:    Talks on phone: Not on file    Gets together: Not on file    Attends religious service: Not on file    Active member of club or organization: Not on file    Attends meetings of clubs or organizations: Not on file    Relationship status: Not on file  Other Topics Concern  . Not on file  Social History Narrative   Foreign travel. Originally from [place] in [place].       Family History: The patient's family history includes Diabetes in his brother and sister; Heart attack (age of onset: 32) in his brother; High Cholesterol in his brother and sister; Stroke (age of onset: 93) in his mother; Suicidality (age of onset: 24) in his father. ROS:   Please see the history of present illness.     All other systems reviewed and are negative.  EKGs/Labs/Other Studies Reviewed:    The following studies were reviewed today:  Myocardial perfusion imaging 10/25/2016 Study Highlights   The left ventricular ejection  fraction is hyperdynamic (>65%).  Nuclear stress EF: 71%.  The study is normal. No evidence of ischemia. No evidence of infarction .  This is a low risk study.     EKG:  EKG is  ordered today.  The ekg ordered today demonstrates NSR with non-specific changes, similar to previous.   Recent Labs: No results found for requested labs within last 8760 hours.   Recent Lipid Panel    Component Value Date/Time   CHOL 109 04/19/2018 1202   TRIG 58 04/19/2018  1202   HDL 41 04/19/2018 1202   CHOLHDL 2.7 04/19/2018 1202   CHOLHDL 3 10/16/2013 0851   VLDL 12.4 10/16/2013 0851   LDLCALC 56 04/19/2018 1202    Physical Exam:    VS:  BP 116/72   Pulse 68   Ht '5\' 5"'  (1.651 m)   Wt 165 lb (74.8 kg)   SpO2 98%   BMI 27.46 kg/m     Wt Readings from Last 3 Encounters:  04/19/18 165 lb (74.8 kg)  11/29/17 172 lb 6.4 oz (78.2 kg)  10/25/16 159 lb (72.1 kg)     Physical Exam  Constitutional: He is oriented to person, place, and time. He appears well-developed and well-nourished. No distress.  HENT:  Head: Normocephalic and atraumatic.  Neck: Normal range of motion. Neck supple. No JVD present.  Cardiovascular: Normal rate, regular rhythm, normal heart sounds and intact distal pulses. Exam reveals no gallop and no friction rub.  No murmur heard. Pulmonary/Chest: Effort normal and breath sounds normal. No respiratory distress. He has no wheezes. He has no rales.  Abdominal: Soft. Bowel sounds are normal.  Musculoskeletal: Normal range of motion. He exhibits no edema or deformity.  Neurological: He is alert and oriented to person, place, and time.  Skin: Skin is warm and dry.  Psychiatric: He has a normal mood and affect. His behavior is normal. Judgment and thought content normal.  Vitals reviewed.    ASSESSMENT:    1. Chest pain, unspecified type   2. Coronary artery disease involving native coronary artery of native heart without angina pectoris   3. Hyperlipidemia, unspecified  hyperlipidemia type   4. Obstructive sleep apnea (adult) (pediatric)   5. Type 2 diabetes mellitus with complication, with long-term current use of insulin (HCC)    PLAN:    In order of problems listed above:  Chest pain: 2 days of new onset of left chest vague discomfort lasting only a few seconds, once or twice a day, with no associated symptoms. His symptoms prior to his CABG were also vague. He travels a lot and does not want to be caught somewhere with worsened symptoms. Will check a troponin, if normal will arrange for an exercise myoview. If normal, no further workup and he should be OK for travel. We discussed that if it is abnormal he would need cardiac cath.   CAD: s/p CABG 2010.  He is on aspirin 81 mg, beta-blocker, high intensity statin and Zetia. He has been doing well until the last 2 days.   Hyperlipidemia: LDL was 82 in February by Dr. Brigitte Pulse on statin.  Zetia was added. Dr. Saunders Revel recommended repeat lipid panel in a month or 2 and if LDL still above 70 refer for PCSK9 inhibitor consideration. Will check lipids today.   OSA: not using CPAP. He feels that he sleeps well. He is more active now. I discussed that he would probably still benefit from using the CPAP.   Diabetes type 2: On lisinopril for renal protection. No recent A1c in Epic. Pt reports good control. Target A1c <7.  Since Dr. Saunders Revel is moving to [facility] and pt does not want to drive there he will need to be reassigned at next visit.   Medication Adjustments/Labs and Tests Ordered: Current medicines are reviewed at length with the patient today.  Concerns regarding medicines are outlined above. Labs and tests ordered and medication changes are outlined in the patient instructions below:  Patient Instructions  Medication Instructions: Your physician recommends that you  continue on your current medications as directed. Please refer to the Current Medication list given to you today.   Labwork: TODAY: TROPONIN ( STAT )  , LIPIDS   Procedures/Testing: Your physician has requested that you have en exercise stress myoview. For further information please visit [email or web address]. Please follow instruction sheet, as given.    Follow-Up: Your physician recommends that you schedule a follow-up appointment in: 3-4 months with Pecolia Ades NP   Any Additional Special Instructions Will Be Listed Below (If Applicable).   DASH Eating Plan DASH stands for "Dietary Approaches to Stop Hypertension." The DASH eating plan is a healthy eating plan that has been shown to reduce high blood pressure (hypertension). It may also reduce your risk for type 2 diabetes, heart disease, and stroke. The DASH eating plan may also help with weight loss. What are tips for following this plan? General guidelines  Avoid eating more than 2,300 mg (milligrams) of salt (sodium) a day. If you have hypertension, you may need to reduce your sodium intake to 1,500 mg a day.  Limit alcohol intake to no more than 1 drink a day for nonpregnant women and 2 drinks a day for men. One drink equals 12 oz of beer, 5 oz of wine, or 1 oz of hard liquor.  Work with your health care provider to maintain a healthy body weight or to lose weight. Ask what an ideal weight is for you.  Get at least 30 minutes of exercise that causes your heart to beat faster (aerobic exercise) most days of the week. Activities may include walking, swimming, or biking.  Work with your health care provider or diet and nutrition specialist (dietitian) to adjust your eating plan to your individual calorie needs. Reading food labels  Check food labels for the amount of sodium per serving. Choose foods with less than 5 percent of the Daily Value of sodium. Generally, foods with less than 300 mg of sodium per serving fit into this eating plan.  To find whole grains, look for the word "whole" as the first word in the ingredient list. Shopping  Buy products labeled as  "low-sodium" or "no salt added."  Buy fresh foods. Avoid canned foods and premade or frozen meals. Cooking  Avoid adding salt when cooking. Use salt-free seasonings or herbs instead of table salt or sea salt. Check with your health care provider or pharmacist before using salt substitutes.  Do not fry foods. Cook foods using healthy methods such as baking, boiling, grilling, and broiling instead.  Cook with heart-healthy oils, such as olive, canola, soybean, or sunflower oil. Meal planning   Eat a balanced diet that includes: ? 5 or more servings of fruits and vegetables each day. At each meal, try to fill half of your plate with fruits and vegetables. ? Up to 6-8 servings of whole grains each day. ? Less than 6 oz of lean meat, poultry, or fish each day. A 3-oz serving of meat is about the same size as a deck of cards. One egg equals 1 oz. ? 2 servings of low-fat dairy each day. ? A serving of nuts, seeds, or beans 5 times each week. ? Heart-healthy fats. Healthy fats called Omega-3 fatty acids are found in foods such as flaxseeds and coldwater fish, like sardines, salmon, and mackerel.  Limit how much you eat of the following: ? Canned or prepackaged foods. ? Food that is high in trans fat, such as fried foods. ? Food that is high in saturated  fat, such as fatty meat. ? Sweets, desserts, sugary drinks, and other foods with added sugar. ? Full-fat dairy products.  Do not salt foods before eating.  Try to eat at least 2 vegetarian meals each week.  Eat more home-cooked food and less restaurant, buffet, and fast food.  When eating at a restaurant, ask that your food be prepared with less salt or no salt, if possible. What foods are recommended? The items listed may not be a complete list. Talk with your dietitian about what dietary choices are best for you. Grains Whole-grain or whole-wheat bread. Whole-grain or whole-wheat pasta. Brown rice. Modena Morrow. Bulgur. Whole-grain  and low-sodium cereals. Pita bread. Low-fat, low-sodium crackers. Whole-wheat flour tortillas. Vegetables Fresh or frozen vegetables (raw, steamed, roasted, or grilled). Low-sodium or reduced-sodium tomato and vegetable juice. Low-sodium or reduced-sodium tomato sauce and tomato paste. Low-sodium or reduced-sodium canned vegetables. Fruits All fresh, dried, or frozen fruit. Canned fruit in natural juice (without added sugar). Meat and other protein foods Skinless chicken or [place]. Ground chicken or [place]. Pork with fat trimmed off. Fish and seafood. Egg whites. Dried beans, peas, or lentils. Unsalted nuts, nut butters, and seeds. Unsalted canned beans. Lean cuts of beef with fat trimmed off. Low-sodium, lean deli meat. Dairy Low-fat (1%) or fat-free (skim) milk. Fat-free, low-fat, or reduced-fat cheeses. Nonfat, low-sodium ricotta or cottage cheese. Low-fat or nonfat yogurt. Low-fat, low-sodium cheese. Fats and oils Soft margarine without trans fats. Vegetable oil. Low-fat, reduced-fat, or light mayonnaise and salad dressings (reduced-sodium). Canola, safflower, olive, soybean, and sunflower oils. Avocado. Seasoning and other foods Herbs. Spices. Seasoning mixes without salt. Unsalted popcorn and pretzels. Fat-free sweets. What foods are not recommended? The items listed may not be a complete list. Talk with your dietitian about what dietary choices are best for you. Grains Baked goods made with fat, such as croissants, muffins, or some breads. Dry pasta or rice meal packs. Vegetables Creamed or fried vegetables. Vegetables in a cheese sauce. Regular canned vegetables (not low-sodium or reduced-sodium). Regular canned tomato sauce and paste (not low-sodium or reduced-sodium). Regular tomato and vegetable juice (not low-sodium or reduced-sodium). Angie Fava. Olives. Fruits Canned fruit in a light or heavy syrup. Fried fruit. Fruit in cream or butter sauce. Meat and other protein foods Fatty cuts  of meat. Ribs. Fried meat. Berniece Salines. Sausage. Bologna and other processed lunch meats. Salami. Fatback. Hotdogs. Bratwurst. Salted nuts and seeds. Canned beans with added salt. Canned or smoked fish. Whole eggs or egg yolks. Chicken or [place] with skin. Dairy Whole or 2% milk, cream, and half-and-half. Whole or full-fat cream cheese. Whole-fat or sweetened yogurt. Full-fat cheese. Nondairy creamers. Whipped toppings. Processed cheese and cheese spreads. Fats and oils Butter. Stick margarine. Lard. Shortening. Ghee. Bacon fat. Tropical oils, such as coconut, palm kernel, or palm oil. Seasoning and other foods Salted popcorn and pretzels. Onion salt, garlic salt, seasoned salt, table salt, and sea salt. Worcestershire sauce. Tartar sauce. Barbecue sauce. Teriyaki sauce. Soy sauce, including reduced-sodium. Steak sauce. Canned and packaged gravies. Fish sauce. Oyster sauce. Cocktail sauce. Horseradish that you find on the shelf. Ketchup. Mustard. Meat flavorings and tenderizers. Bouillon cubes. Hot sauce and Tabasco sauce. Premade or packaged marinades. Premade or packaged taco seasonings. Relishes. Regular salad dressings. Where to find more information:  National Heart, Lung, and [place]: https://wilson-eaton.com/  American Heart Association: www.heart.org Summary  The DASH eating plan is a healthy eating plan that has been shown to reduce high blood pressure (hypertension). It may also reduce  your risk for type 2 diabetes, heart disease, and stroke.  With the DASH eating plan, you should limit salt (sodium) intake to 2,300 mg a day. If you have hypertension, you may need to reduce your sodium intake to 1,500 mg a day.  When on the DASH eating plan, aim to eat more fresh fruits and vegetables, whole grains, lean proteins, low-fat dairy, and heart-healthy fats.  Work with your health care provider or diet and nutrition specialist (dietitian) to adjust your eating plan to your individual calorie  needs. This information is not intended to replace advice given to you by your health care provider. Make sure you discuss any questions you have with your health care provider. Document Released: 07/20/2011 Document Revised: 07/24/2016 Document Reviewed: 07/24/2016 Elsevier Interactive Patient Education  [employer].    If you need a refill on your cardiac medications before your next appointment, please call your pharmacy.      Signed, Daune Perch, NP  04/19/2018 [facility]

## 2018-04-19 NOTE — Patient Instructions (Addendum)
Medication Instructions: Your physician recommends that you continue on your current medications as directed. Please refer to the Current Medication list given to you today.   Labwork: TODAY: TROPONIN ( STAT ) , LIPIDS   Procedures/Testing: Your physician has requested that you have en exercise stress myoview. For further information please visit [email or web address] Please follow instruction sheet, as given.    Follow-Up: Your physician recommends that you schedule a follow-up appointment in: 3-4 months with Lizabeth Leyden NP   Any Additional Special Instructions Will Be Listed Below (If Applicable).   DASH Eating Plan DASH stands for "Dietary Approaches to Stop Hypertension." The DASH eating plan is a healthy eating plan that has been shown to reduce high blood pressure (hypertension). It may also reduce your risk for type 2 diabetes, heart disease, and stroke. The DASH eating plan may also help with weight loss. What are tips for following this plan? General guidelines  Avoid eating more than 2,300 mg (milligrams) of salt (sodium) a day. If you have hypertension, you may need to reduce your sodium intake to 1,500 mg a day.  Limit alcohol intake to no more than 1 drink a day for nonpregnant women and 2 drinks a day for men. One drink equals 12 oz of beer, 5 oz of wine, or 1 oz of hard liquor.  Work with your health care provider to maintain a healthy body weight or to lose weight. Ask what an ideal weight is for you.  Get at least 30 minutes of exercise that causes your heart to beat faster (aerobic exercise) most days of the week. Activities may include walking, swimming, or biking.  Work with your health care provider or diet and nutrition specialist (dietitian) to adjust your eating plan to your individual calorie needs. Reading food labels  Check food labels for the amount of sodium per serving. Choose foods with less than 5 percent of the Daily Value of sodium. Generally,  foods with less than 300 mg of sodium per serving fit into this eating plan.  To find whole grains, look for the word "whole" as the first word in the ingredient list. Shopping  Buy products labeled as "low-sodium" or "no salt added."  Buy fresh foods. Avoid canned foods and premade or frozen meals. Cooking  Avoid adding salt when cooking. Use salt-free seasonings or herbs instead of table salt or sea salt. Check with your health care provider or pharmacist before using salt substitutes.  Do not fry foods. Cook foods using healthy methods such as baking, boiling, grilling, and broiling instead.  Cook with heart-healthy oils, such as olive, canola, soybean, or sunflower oil. Meal planning   Eat a balanced diet that includes: ? 5 or more servings of fruits and vegetables each day. At each meal, try to fill half of your plate with fruits and vegetables. ? Up to 6-8 servings of whole grains each day. ? Less than 6 oz of lean meat, poultry, or fish each day. A 3-oz serving of meat is about the same size as a deck of cards. One egg equals 1 oz. ? 2 servings of low-fat dairy each day. ? A serving of nuts, seeds, or beans 5 times each week. ? Heart-healthy fats. Healthy fats called Omega-3 fatty acids are found in foods such as flaxseeds and coldwater fish, like sardines, salmon, and mackerel.  Limit how much you eat of the following: ? Canned or prepackaged foods. ? Food that is high in trans fat, such as fried foods. ?  Food that is high in saturated fat, such as fatty meat. ? Sweets, desserts, sugary drinks, and other foods with added sugar. ? Full-fat dairy products.  Do not salt foods before eating.  Try to eat at least 2 vegetarian meals each week.  Eat more home-cooked food and less restaurant, buffet, and fast food.  When eating at a restaurant, ask that your food be prepared with less salt or no salt, if possible. What foods are recommended? The items listed may not be a  complete list. Talk with your dietitian about what dietary choices are best for you. Grains Whole-grain or whole-wheat bread. Whole-grain or whole-wheat pasta. Brown rice. Orpah Cobb. Bulgur. Whole-grain and low-sodium cereals. Pita bread. Low-fat, low-sodium crackers. Whole-wheat flour tortillas. Vegetables Fresh or frozen vegetables (raw, steamed, roasted, or grilled). Low-sodium or reduced-sodium tomato and vegetable juice. Low-sodium or reduced-sodium tomato sauce and tomato paste. Low-sodium or reduced-sodium canned vegetables. Fruits All fresh, dried, or frozen fruit. Canned fruit in natural juice (without added sugar). Meat and other protein foods Skinless chicken or [place]. Ground chicken or [place]. Pork with fat trimmed off. Fish and seafood. Egg whites. Dried beans, peas, or lentils. Unsalted nuts, nut butters, and seeds. Unsalted canned beans. Lean cuts of beef with fat trimmed off. Low-sodium, lean deli meat. Dairy Low-fat (1%) or fat-free (skim) milk. Fat-free, low-fat, or reduced-fat cheeses. Nonfat, low-sodium ricotta or cottage cheese. Low-fat or nonfat yogurt. Low-fat, low-sodium cheese. Fats and oils Soft margarine without trans fats. Vegetable oil. Low-fat, reduced-fat, or light mayonnaise and salad dressings (reduced-sodium). Canola, safflower, olive, soybean, and sunflower oils. Avocado. Seasoning and other foods Herbs. Spices. Seasoning mixes without salt. Unsalted popcorn and pretzels. Fat-free sweets. What foods are not recommended? The items listed may not be a complete list. Talk with your dietitian about what dietary choices are best for you. Grains Baked goods made with fat, such as croissants, muffins, or some breads. Dry pasta or rice meal packs. Vegetables Creamed or fried vegetables. Vegetables in a cheese sauce. Regular canned vegetables (not low-sodium or reduced-sodium). Regular canned tomato sauce and paste (not low-sodium or reduced-sodium). Regular  tomato and vegetable juice (not low-sodium or reduced-sodium). Rosita Fire. Olives. Fruits Canned fruit in a light or heavy syrup. Fried fruit. Fruit in cream or butter sauce. Meat and other protein foods Fatty cuts of meat. Ribs. Fried meat. Tomasa Blase. Sausage. Bologna and other processed lunch meats. Salami. Fatback. Hotdogs. Bratwurst. Salted nuts and seeds. Canned beans with added salt. Canned or smoked fish. Whole eggs or egg yolks. Chicken or [place] with skin. Dairy Whole or 2% milk, cream, and half-and-half. Whole or full-fat cream cheese. Whole-fat or sweetened yogurt. Full-fat cheese. Nondairy creamers. Whipped toppings. Processed cheese and cheese spreads. Fats and oils Butter. Stick margarine. Lard. Shortening. Ghee. Bacon fat. Tropical oils, such as coconut, palm kernel, or palm oil. Seasoning and other foods Salted popcorn and pretzels. Onion salt, garlic salt, seasoned salt, table salt, and sea salt. Worcestershire sauce. Tartar sauce. Barbecue sauce. Teriyaki sauce. Soy sauce, including reduced-sodium. Steak sauce. Canned and packaged gravies. Fish sauce. Oyster sauce. Cocktail sauce. Horseradish that you find on the shelf. Ketchup. Mustard. Meat flavorings and tenderizers. Bouillon cubes. Hot sauce and Tabasco sauce. Premade or packaged marinades. Premade or packaged taco seasonings. Relishes. Regular salad dressings. Where to find more information:  National Heart, Lung, and Blood Institute: PopSteam.is  American Heart Association: www.heart.org Summary  The DASH eating plan is a healthy eating plan that has been shown to reduce high blood  pressure (hypertension). It may also reduce your risk for type 2 diabetes, heart disease, and stroke.  With the DASH eating plan, you should limit salt (sodium) intake to 2,300 mg a day. If you have hypertension, you may need to reduce your sodium intake to 1,500 mg a day.  When on the DASH eating plan, aim to eat more fresh fruits and  vegetables, whole grains, lean proteins, low-fat dairy, and heart-healthy fats.  Work with your health care provider or diet and nutrition specialist (dietitian) to adjust your eating plan to your individual calorie needs. This information is not intended to replace advice given to you by your health care provider. Make sure you discuss any questions you have with your health care provider. Document Released: 07/20/2011 Document Revised: 07/24/2016 Document Reviewed: 07/24/2016 Elsevier Interactive Patient Education  [employer].    If you need a refill on your cardiac medications before your next appointment, please call your pharmacy.

## 2018-05-02 ENCOUNTER — Encounter: Payer: Self-pay | Admitting: Cardiology

## 2018-05-17 ENCOUNTER — Ambulatory Visit: Payer: BLUE CROSS/BLUE SHIELD | Admitting: Physician Assistant

## 2018-05-29 ENCOUNTER — Telehealth: Payer: Self-pay

## 2018-05-29 NOTE — Telephone Encounter (Signed)
New message    Just an FYI.  We have made several attempts to contact this patient including sending a letter to schedule or reschedule their MYOCARDIAL PERFUSION. We will be removing the patient from the WQ.   Thank you 

## 2018-08-23 DIAGNOSIS — N529 Male erectile dysfunction, unspecified: Secondary | ICD-10-CM | POA: Diagnosis not present

## 2018-08-23 DIAGNOSIS — M542 Cervicalgia: Secondary | ICD-10-CM | POA: Diagnosis not present

## 2018-08-23 DIAGNOSIS — Z6828 Body mass index (BMI) 28.0-28.9, adult: Secondary | ICD-10-CM | POA: Diagnosis not present

## 2018-09-24 NOTE — Progress Notes (Signed)
Cardiology Office Note   Date:  09/26/2018   ID:  Mario PolesRobert K Mcdonald, DOB [birth year], MRN [id number]  PCP:  Martha ClanShaw, William, MD  Cardiologist:   Charlton HawsPeter Devinne Epstein, MD   No chief complaint on file.     History of Present Illness: Mario PolesRobert K Mcdonald is a 63 y.o. male who presents for consultation regarding CAD. Transferring care from Dr End. 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 CRF;s include DM-2 and HLD. Seems to get nervous about his CAD prior to mission trips when he is going out of country Had a normal myovue 10/25/16 with EF 71%   Active Walking daily Has mission trip to [place] this month and will be gone for 4 weeks Takes his nitro with him No angina  [place] are from [place]  Has a brother in [place]     Past Medical History:  Diagnosis Date  . Coronary artery disease    ETT-myoview 01/27/09: large basal to apical anterior and anteroseptal reversible defect.  ECG also positive  for ischemia.  LHC (6/10) showed EF 65% totally occluded LAD with collaterals from RCA, 30-40% proximal CFX stenosis  . Diabetes mellitus    pre diabetes  . GERD (gastroesophageal reflux disease)   . Hyperlipidemia     Past Surgical History:  Procedure Laterality Date  . DOPPLER ECHOCARDIOGRAPHY     (6/10): EF 60-65%, NO AS/AI, no MR, normal RV.  Marland Kitchen. open heart surgery  01/2009     Current Outpatient Medications  Medication Sig Dispense Refill  . ALPHA LIPOIC ACID PO Take 200 mg by mouth daily.    . Ascorbic Acid (VITAMIN C) 1000 MG tablet Take 1,000 mg by mouth daily.      Marland Kitchen. aspirin EC 81 MG tablet Take 81 mg by mouth daily.    . cholecalciferol (VITAMIN D) 1000 UNITS tablet Take 1,000 Units by mouth daily.      . Coenzyme Q10 (CO Q 10 PO) Take 1 capsule by mouth daily.    Marland Kitchen. ezetimibe (ZETIA) 10 MG tablet Take 1 tablet (10 mg total) by mouth daily. 90 tablet 3  . FREESTYLE LITE test strip USE TO TEST BLOOD SUGARS TWICE A DAY 200 each 0  . lisinopril  (PRINIVIL,ZESTRIL) 2.5 MG tablet TAKE ONE TABLET BY MOUTH TWICE DAILY 180 tablet 2  . metFORMIN (GLUCOPHAGE) 500 MG tablet Take 500 mg by mouth 2 (two) times daily. Takes 1-2 times daily depending on blood sugar readings  1  . metoprolol tartrate (LOPRESSOR) 25 MG tablet TAKE ONE-HALF TABLET BY MOUTH TWICE DAILY 90 tablet 0  . Multiple Vitamin (MULTIVITAMINS PO) Take 1 tablet by mouth daily. CENTRUM SILVER    . nitroGLYCERIN (NITROSTAT) 0.4 MG SL tablet Place 1 tablet (0.4 mg total) under the tongue every 5 (five) minutes as needed for chest pain. 25 tablet 0  . rosuvastatin (CRESTOR) 40 MG tablet Take 40 mg by mouth daily.  3   No current facility-administered medications for this visit.     Allergies:   Patient has no known allergies.    Social History:  The patient  reports that he has never smoked. He has never used smokeless tobacco. He reports that he does not drink alcohol or use drugs.   Family History:  The patient's family history includes Diabetes in his brother and sister; Heart attack (age of onset: 7748) in his brother; High Cholesterol in his brother and sister; Stroke (age of  onset: 25) in his mother; Suicidality (age of onset: 78) in his father.    ROS:  Please see the history of present illness.   Otherwise, review of systems are positive for none.   All other systems are reviewed and negative.    PHYSICAL EXAM: VS:  BP 104/66   Pulse 65   Ht 5\' 5"  (1.651 m)   Wt 165 lb 12.8 oz (75.2 kg)   SpO2 98%   BMI 27.59 kg/m  , BMI Body mass index is 27.59 kg/m. Affect appropriate Healthy:  appears stated age HEENT: normal Neck supple with no adenopathy JVP normal no bruits no thyromegaly Lungs clear with no wheezing and good diaphragmatic motion Heart:  S1/S2 no murmur, no rub, gallop or click PMI normal Abdomen: benighn, BS positve, no tenderness, no AAA no bruit.  No HSM or HJR Distal pulses intact with no bruits No edema Neuro non-focal Skin warm and dry No  muscular weakness    EKG:  SR rate 61 nonspecific ST changes 04/19/18   Recent Labs: No results found for requested labs within last 8760 hours.    Lipid Panel    Component Value Date/Time   CHOL 109 04/19/2018 1202   TRIG 58 04/19/2018 1202   HDL 41 04/19/2018 1202   CHOLHDL 2.7 04/19/2018 1202   CHOLHDL 3 10/16/2013 0851   VLDL 12.4 10/16/2013 0851   LDLCALC 56 04/19/2018 1202      Wt Readings from Last 3 Encounters:  09/26/18 165 lb 12.8 oz (75.2 kg)  04/19/18 165 lb (74.8 kg)  11/29/17 172 lb 6.4 oz (78.2 kg)      Other studies Reviewed: Additional studies/ records that were reviewed today include: Notes from Dr End and PA, myovue , TTE cath note 2010 and CABG note .    ASSESSMENT AND PLAN:  1.  CAD/CABG:  2010 had CTO LAD. Only got LIMA to LAD and SVG D2 No graft to RCA or circumflex. EF normal continue medical Rx 2. HLD:  Continue statin and zetia labs with primary Dr [name] 3. DM:  Discussed low carb diet.  Target hemoglobin A1c is 6.5 or less.  Continue current medications. 4. OSA:  Not wearing CPAP discussed   Current medicines are reviewed at length with the patient today.  The patient does not have concerns regarding medicines.  The following changes have been made:  no change  Labs/ tests ordered today include: None  No orders of the defined types were placed in this encounter.    Disposition:   FU with cardiology in a year      Signed, Charlton Haws, MD  09/26/2018 8:44 AM    [facility] [address] [place], [place], [place]  [address] Phone: [phone]; Fax: [phone]

## 2018-09-26 ENCOUNTER — Encounter: Payer: Self-pay | Admitting: Cardiovascular Disease

## 2018-09-26 ENCOUNTER — Ambulatory Visit: Payer: BLUE CROSS/BLUE SHIELD | Admitting: Cardiovascular Disease

## 2018-09-26 VITALS — BP 104/66 | HR 65 | Ht 65.0 in | Wt 165.8 lb

## 2018-09-26 DIAGNOSIS — I2510 Atherosclerotic heart disease of native coronary artery without angina pectoris: Secondary | ICD-10-CM

## 2018-09-26 DIAGNOSIS — E785 Hyperlipidemia, unspecified: Secondary | ICD-10-CM

## 2018-09-26 MED ORDER — NITROGLYCERIN 0.4 MG SL SUBL: 0.4000 mg | SUBLINGUAL_TABLET | SUBLINGUAL | 3 refills | Status: AC | PRN

## 2018-09-26 MED ORDER — EZETIMIBE 10 MG PO TABS: 10.0000 mg | ORAL_TABLET | Freq: Every day | ORAL | 3 refills | Status: DC

## 2018-09-26 NOTE — Patient Instructions (Addendum)

## 2018-10-11 ENCOUNTER — Ambulatory Visit: Payer: BLUE CROSS/BLUE SHIELD | Admitting: Cardiovascular Disease

## 2018-11-01 DIAGNOSIS — Z119 Encounter for screening for infectious and parasitic diseases, unspecified: Secondary | ICD-10-CM | POA: Diagnosis not present

## 2018-11-01 DIAGNOSIS — R05 Cough: Secondary | ICD-10-CM | POA: Diagnosis not present

## 2018-11-01 DIAGNOSIS — J029 Acute pharyngitis, unspecified: Secondary | ICD-10-CM | POA: Diagnosis not present

## 2018-12-16 DIAGNOSIS — R82998 Other abnormal findings in urine: Secondary | ICD-10-CM | POA: Diagnosis not present

## 2018-12-16 DIAGNOSIS — Z125 Encounter for screening for malignant neoplasm of prostate: Secondary | ICD-10-CM | POA: Diagnosis not present

## 2018-12-16 DIAGNOSIS — E119 Type 2 diabetes mellitus without complications: Secondary | ICD-10-CM | POA: Diagnosis not present

## 2018-12-16 DIAGNOSIS — I10 Essential (primary) hypertension: Secondary | ICD-10-CM | POA: Diagnosis not present

## 2018-12-20 DIAGNOSIS — Z1331 Encounter for screening for depression: Secondary | ICD-10-CM | POA: Diagnosis not present

## 2018-12-20 DIAGNOSIS — I10 Essential (primary) hypertension: Secondary | ICD-10-CM | POA: Diagnosis not present

## 2018-12-20 DIAGNOSIS — I2510 Atherosclerotic heart disease of native coronary artery without angina pectoris: Secondary | ICD-10-CM | POA: Diagnosis not present

## 2018-12-20 DIAGNOSIS — E785 Hyperlipidemia, unspecified: Secondary | ICD-10-CM | POA: Diagnosis not present

## 2018-12-20 DIAGNOSIS — E119 Type 2 diabetes mellitus without complications: Secondary | ICD-10-CM | POA: Diagnosis not present

## 2018-12-20 DIAGNOSIS — Z0000 Encounter for general adult medical examination without abnormal findings: Secondary | ICD-10-CM | POA: Diagnosis not present

## 2019-01-29 ENCOUNTER — Telehealth: Payer: Self-pay | Admitting: Cardiovascular Disease

## 2019-01-29 NOTE — Telephone Encounter (Signed)
Disability parking application received. Placed in box for Dr. Johnsie Cancel to sign. 01/29/19 vlm

## 2019-07-03 ENCOUNTER — Other Ambulatory Visit: Payer: Self-pay

## 2019-07-03 DIAGNOSIS — Z20822 Contact with and (suspected) exposure to covid-19: Secondary | ICD-10-CM

## 2019-07-03 DIAGNOSIS — I10 Essential (primary) hypertension: Secondary | ICD-10-CM | POA: Diagnosis not present

## 2019-07-03 DIAGNOSIS — M79604 Pain in right leg: Secondary | ICD-10-CM | POA: Diagnosis not present

## 2019-07-03 DIAGNOSIS — E119 Type 2 diabetes mellitus without complications: Secondary | ICD-10-CM | POA: Diagnosis not present

## 2019-07-03 DIAGNOSIS — E785 Hyperlipidemia, unspecified: Secondary | ICD-10-CM | POA: Diagnosis not present

## 2019-07-03 DIAGNOSIS — Z23 Encounter for immunization: Secondary | ICD-10-CM | POA: Diagnosis not present

## 2019-07-05 LAB — NOVEL CORONAVIRUS, NAA: SARS-CoV-2, NAA: NOT DETECTED

## 2019-10-17 ENCOUNTER — Other Ambulatory Visit: Payer: Self-pay | Admitting: Cardiovascular Disease

## 2019-10-21 ENCOUNTER — Other Ambulatory Visit: Payer: Self-pay | Admitting: Cardiovascular Disease

## 2020-12-15 DIAGNOSIS — Z951 Presence of aortocoronary bypass graft: Secondary | ICD-10-CM | POA: Diagnosis not present

## 2020-12-15 DIAGNOSIS — I10 Essential (primary) hypertension: Secondary | ICD-10-CM | POA: Diagnosis not present

## 2020-12-15 DIAGNOSIS — E782 Mixed hyperlipidemia: Secondary | ICD-10-CM | POA: Diagnosis not present

## 2020-12-15 DIAGNOSIS — I2510 Atherosclerotic heart disease of native coronary artery without angina pectoris: Secondary | ICD-10-CM | POA: Diagnosis not present

## 2021-01-13 DIAGNOSIS — E785 Hyperlipidemia, unspecified: Secondary | ICD-10-CM | POA: Diagnosis not present

## 2021-01-13 DIAGNOSIS — I10 Essential (primary) hypertension: Secondary | ICD-10-CM | POA: Diagnosis not present

## 2021-01-13 DIAGNOSIS — E119 Type 2 diabetes mellitus without complications: Secondary | ICD-10-CM | POA: Diagnosis not present

## 2021-01-13 DIAGNOSIS — Z0000 Encounter for general adult medical examination without abnormal findings: Secondary | ICD-10-CM | POA: Diagnosis not present

## 2021-01-20 DIAGNOSIS — Z0000 Encounter for general adult medical examination without abnormal findings: Secondary | ICD-10-CM | POA: Diagnosis not present

## 2021-01-20 DIAGNOSIS — Z1339 Encounter for screening examination for other mental health and behavioral disorders: Secondary | ICD-10-CM | POA: Diagnosis not present

## 2021-01-20 DIAGNOSIS — E119 Type 2 diabetes mellitus without complications: Secondary | ICD-10-CM | POA: Diagnosis not present

## 2021-01-20 DIAGNOSIS — Z1331 Encounter for screening for depression: Secondary | ICD-10-CM | POA: Diagnosis not present

## 2021-02-07 DIAGNOSIS — E119 Type 2 diabetes mellitus without complications: Secondary | ICD-10-CM | POA: Diagnosis not present

## 2021-06-20 DIAGNOSIS — E782 Mixed hyperlipidemia: Secondary | ICD-10-CM | POA: Diagnosis not present

## 2021-06-20 DIAGNOSIS — I10 Essential (primary) hypertension: Secondary | ICD-10-CM | POA: Diagnosis not present

## 2021-06-20 DIAGNOSIS — I2510 Atherosclerotic heart disease of native coronary artery without angina pectoris: Secondary | ICD-10-CM | POA: Diagnosis not present

## 2021-06-20 DIAGNOSIS — Z951 Presence of aortocoronary bypass graft: Secondary | ICD-10-CM | POA: Diagnosis not present

## 2021-07-13 DIAGNOSIS — E785 Hyperlipidemia, unspecified: Secondary | ICD-10-CM | POA: Diagnosis not present

## 2021-07-13 DIAGNOSIS — E119 Type 2 diabetes mellitus without complications: Secondary | ICD-10-CM | POA: Diagnosis not present

## 2021-08-10 DIAGNOSIS — E119 Type 2 diabetes mellitus without complications: Secondary | ICD-10-CM | POA: Diagnosis not present

## 2022-05-18 DIAGNOSIS — I10 Essential (primary) hypertension: Secondary | ICD-10-CM | POA: Diagnosis not present

## 2022-05-18 DIAGNOSIS — R82998 Other abnormal findings in urine: Secondary | ICD-10-CM | POA: Diagnosis not present

## 2022-05-18 DIAGNOSIS — Z125 Encounter for screening for malignant neoplasm of prostate: Secondary | ICD-10-CM | POA: Diagnosis not present

## 2022-05-18 DIAGNOSIS — E119 Type 2 diabetes mellitus without complications: Secondary | ICD-10-CM | POA: Diagnosis not present

## 2022-05-25 DIAGNOSIS — E119 Type 2 diabetes mellitus without complications: Secondary | ICD-10-CM | POA: Diagnosis not present

## 2022-05-25 DIAGNOSIS — I10 Essential (primary) hypertension: Secondary | ICD-10-CM | POA: Diagnosis not present

## 2022-05-25 DIAGNOSIS — Z1339 Encounter for screening examination for other mental health and behavioral disorders: Secondary | ICD-10-CM | POA: Diagnosis not present

## 2022-05-25 DIAGNOSIS — Z0000 Encounter for general adult medical examination without abnormal findings: Secondary | ICD-10-CM | POA: Diagnosis not present

## 2022-05-25 DIAGNOSIS — Z1331 Encounter for screening for depression: Secondary | ICD-10-CM | POA: Diagnosis not present

## 2022-06-28 DIAGNOSIS — Z23 Encounter for immunization: Secondary | ICD-10-CM | POA: Diagnosis not present

## 2022-07-17 DIAGNOSIS — I2510 Atherosclerotic heart disease of native coronary artery without angina pectoris: Secondary | ICD-10-CM | POA: Diagnosis not present

## 2022-07-17 DIAGNOSIS — I10 Essential (primary) hypertension: Secondary | ICD-10-CM | POA: Diagnosis not present

## 2022-07-17 DIAGNOSIS — E782 Mixed hyperlipidemia: Secondary | ICD-10-CM | POA: Diagnosis not present

## 2022-07-17 DIAGNOSIS — Z951 Presence of aortocoronary bypass graft: Secondary | ICD-10-CM | POA: Diagnosis not present

## 2022-09-27 DIAGNOSIS — Z23 Encounter for immunization: Secondary | ICD-10-CM | POA: Diagnosis not present

## 2022-11-03 ENCOUNTER — Other Ambulatory Visit (HOSPITAL_BASED_OUTPATIENT_CLINIC_OR_DEPARTMENT_OTHER): Payer: Self-pay | Admitting: Registered Nurse

## 2022-11-03 ENCOUNTER — Ambulatory Visit (HOSPITAL_BASED_OUTPATIENT_CLINIC_OR_DEPARTMENT_OTHER)
Admission: RE | Admit: 2022-11-03 | Discharge: 2022-11-03 | Disposition: A | Discharge status: Home / Self Care | Payer: Medicare Other | Source: Ambulatory Visit | Attending: Registered Nurse | Admitting: Registered Nurse

## 2022-11-03 DIAGNOSIS — M7989 Other specified soft tissue disorders: Secondary | ICD-10-CM | POA: Diagnosis not present

## 2022-11-03 DIAGNOSIS — I10 Essential (primary) hypertension: Secondary | ICD-10-CM | POA: Diagnosis not present

## 2022-11-03 DIAGNOSIS — M79604 Pain in right leg: Secondary | ICD-10-CM | POA: Insufficient documentation

## 2022-11-03 DIAGNOSIS — M25561 Pain in right knee: Secondary | ICD-10-CM | POA: Diagnosis not present

## 2022-12-07 DIAGNOSIS — E119 Type 2 diabetes mellitus without complications: Secondary | ICD-10-CM | POA: Diagnosis not present

## 2022-12-07 DIAGNOSIS — E785 Hyperlipidemia, unspecified: Secondary | ICD-10-CM | POA: Diagnosis not present

## 2022-12-07 DIAGNOSIS — I2510 Atherosclerotic heart disease of native coronary artery without angina pectoris: Secondary | ICD-10-CM | POA: Diagnosis not present

## 2022-12-07 DIAGNOSIS — I10 Essential (primary) hypertension: Secondary | ICD-10-CM | POA: Diagnosis not present

## 2022-12-28 DIAGNOSIS — E119 Type 2 diabetes mellitus without complications: Secondary | ICD-10-CM | POA: Diagnosis not present

## 2023-02-26 DIAGNOSIS — I10 Essential (primary) hypertension: Secondary | ICD-10-CM | POA: Diagnosis not present

## 2023-02-26 DIAGNOSIS — E119 Type 2 diabetes mellitus without complications: Secondary | ICD-10-CM | POA: Diagnosis not present

## 2023-04-25 DIAGNOSIS — I10 Essential (primary) hypertension: Secondary | ICD-10-CM | POA: Diagnosis not present

## 2023-04-25 DIAGNOSIS — E782 Mixed hyperlipidemia: Secondary | ICD-10-CM | POA: Diagnosis not present

## 2023-04-25 DIAGNOSIS — I2510 Atherosclerotic heart disease of native coronary artery without angina pectoris: Secondary | ICD-10-CM | POA: Diagnosis not present

## 2023-04-25 DIAGNOSIS — E119 Type 2 diabetes mellitus without complications: Secondary | ICD-10-CM | POA: Diagnosis not present

## 2023-06-07 DIAGNOSIS — E785 Hyperlipidemia, unspecified: Secondary | ICD-10-CM | POA: Diagnosis not present

## 2023-06-07 DIAGNOSIS — Z1389 Encounter for screening for other disorder: Secondary | ICD-10-CM | POA: Diagnosis not present

## 2023-06-07 DIAGNOSIS — E119 Type 2 diabetes mellitus without complications: Secondary | ICD-10-CM | POA: Diagnosis not present

## 2023-06-07 DIAGNOSIS — Z0001 Encounter for general adult medical examination with abnormal findings: Secondary | ICD-10-CM | POA: Diagnosis not present

## 2023-06-14 DIAGNOSIS — R82998 Other abnormal findings in urine: Secondary | ICD-10-CM | POA: Diagnosis not present

## 2023-06-14 DIAGNOSIS — I10 Essential (primary) hypertension: Secondary | ICD-10-CM | POA: Diagnosis not present

## 2023-06-14 DIAGNOSIS — Z23 Encounter for immunization: Secondary | ICD-10-CM | POA: Diagnosis not present

## 2023-06-14 DIAGNOSIS — Z0000 Encounter for general adult medical examination without abnormal findings: Secondary | ICD-10-CM | POA: Diagnosis not present

## 2023-06-14 DIAGNOSIS — E119 Type 2 diabetes mellitus without complications: Secondary | ICD-10-CM | POA: Diagnosis not present

## 2023-06-20 NOTE — Progress Notes (Deleted)
CARDIOLOGY CONSULT NOTE       Patient ID: [name] MRN: [id number] DOB/AGE: 1956-04-19 67 y.o.  Admit date: (Not on file) Referring Physician: [name] Primary Physician: [name]., MD Primary Cardiologist: New Reason for Consultation: CAD/CABG  Active Problems:   * No active hospital problems. *   HPI:  67 y.o. not seen since 2020 Previously seen by Dr End and myself in 2020. CABG June 2010 with LIMA to LAD, SVG to D2, CRF;s include DM and HLD Last normal myovue done March 2018 Did not have grafting of LCX or RCA Last seen cardiology Dr [name] with [facility] in [place]. 04/25/23 Echo done 11/27/19 with normal EF 60-65%   Does mission trips to [place] a lot with no angina   ***  ROS All other systems reviewed and negative except as noted above  Past Medical History:  Diagnosis Date   Coronary artery disease    ETT-myoview 01/27/09: large basal to apical anterior and anteroseptal reversible defect.  ECG also positive  for ischemia.  LHC (6/10) showed EF 65% totally occluded LAD with collaterals from RCA, 30-40% proximal CFX stenosis   Diabetes mellitus    pre diabetes   GERD (gastroesophageal reflux disease)    Hyperlipidemia     Family History  Problem Relation Age of Onset   Stroke Mother 59   Suicidality Father 52   Heart attack Brother 50   Diabetes Brother    High Cholesterol Brother    Diabetes Sister    High Cholesterol Sister     Social History   Socioeconomic History   Marital status: Married    Spouse name: Not on file   Number of children: 1   Years of education: college   Highest education level: Not on file  Occupational History   Occupation: [employer]  Tobacco Use   Smoking status: Never   Smokeless tobacco: Never  Vaping Use   Vaping status: Never Used  Substance and Sexual Activity   Alcohol use: No   Drug use: No   Sexual activity: Not on file  Other Topics Concern   Not on file  Social History Narrative    Foreign travel. Originally from [place] in [place].     Social Determinants of Health   Financial Resource Strain: Not on file  Food Insecurity: Not on file  Transportation Needs: Not on file  Physical Activity: Not on file  Stress: Not on file  Social Connections: Not on file  Intimate Partner Violence: Not on file    Past Surgical History:  Procedure Laterality Date   DOPPLER ECHOCARDIOGRAPHY     (6/10): EF 60-65%, NO AS/AI, no MR, normal RV.   open heart surgery  01/2009      Current Outpatient Medications:    ALPHA LIPOIC ACID PO, Take 200 mg by mouth daily., Disp: , Rfl:    Ascorbic Acid (VITAMIN C) 1000 MG tablet, Take 1,000 mg by mouth daily.  , Disp: , Rfl:    aspirin EC 81 MG tablet, Take 81 mg by mouth daily., Disp: , Rfl:    cholecalciferol (VITAMIN D) 1000 UNITS tablet, Take 1,000 Units by mouth daily.  , Disp: , Rfl:    Coenzyme Q10 (CO Q 10 PO), Take 1 capsule by mouth daily., Disp: , Rfl:    ezetimibe (ZETIA) 10 MG tablet, Take 1 tablet (10 mg total) by mouth daily. Please schedule annual appt with Dr. Eden Emms for refills. [phone]. 1st attempt., Disp:  30 tablet, Rfl: 0   FREESTYLE LITE test strip, USE TO TEST BLOOD SUGARS TWICE A DAY, Disp: 200 each, Rfl: 0   lisinopril (PRINIVIL,ZESTRIL) 2.5 MG tablet, TAKE ONE TABLET BY MOUTH TWICE DAILY, Disp: 180 tablet, Rfl: 2   metFORMIN (GLUCOPHAGE) 500 MG tablet, Take 500 mg by mouth 2 (two) times daily. Takes 1-2 times daily depending on blood sugar readings, Disp: , Rfl: 1   metoprolol tartrate (LOPRESSOR) 25 MG tablet, TAKE ONE-HALF TABLET BY MOUTH TWICE DAILY, Disp: 90 tablet, Rfl: 0   Multiple Vitamin (MULTIVITAMINS PO), Take 1 tablet by mouth daily. CENTRUM SILVER, Disp: , Rfl:    nitroGLYCERIN (NITROSTAT) 0.4 MG SL tablet, Place 1 tablet (0.4 mg total) under the tongue every 5 (five) minutes as needed for chest pain., Disp: 25 tablet, Rfl: 3   rosuvastatin (CRESTOR) 40 MG tablet, Take 40 mg by mouth daily.,  Disp: , Rfl: 3    Physical Exam: There were no vitals taken for this visit.  Affect appropriate Healthy:  appears stated age HEENT: normal Neck supple with no adenopathy JVP normal no bruits no thyromegaly Lungs clear with no wheezing and good diaphragmatic motion Heart:  S1/S2 no murmur, no rub, gallop or click PMI normal  prior sternotomy  Abdomen: benighn, BS positve, no tenderness, no AAA no bruit.  No HSM or HJR Distal pulses intact with no bruits No edema Neuro non-focal Skin warm and dry No muscular weakness   Labs:   Lab Results  Component Value Date   WBC 5.7 03/02/2016   HGB 15.5 03/02/2016   HCT 46.8 03/02/2016   MCV 80.5 03/02/2016   PLT 276.0 03/02/2016   No results for input(s): "NA", "K", "CL", "CO2", "BUN", "CREATININE", "CALCIUM", "PROT", "BILITOT", "ALKPHOS", "ALT", "AST", "GLUCOSE" in the last 168 hours.  Invalid input(s): "LABALBU" Lab Results  Component Value Date   CKTOTAL 308 (H) 02/02/2009   CKMB 7.6 (H) 02/02/2009   TROPONINI 0.02        NO INDICATION OF MYOCARDIAL INJURY. 01/28/2009    Lab Results  Component Value Date   CHOL 109 04/19/2018   CHOL 113 10/16/2013   CHOL 122 11/18/2012   Lab Results  Component Value Date   HDL 41 04/19/2018   HDL 37.60 (L) 10/16/2013   HDL 38.10 (L) 11/18/2012   Lab Results  Component Value Date   LDLCALC 56 04/19/2018   LDLCALC 63 10/16/2013   LDLCALC 73 11/18/2012   Lab Results  Component Value Date   TRIG 58 04/19/2018   TRIG 62.0 10/16/2013   TRIG 56.0 11/18/2012   Lab Results  Component Value Date   CHOLHDL 2.7 04/19/2018   CHOLHDL 3 10/16/2013   CHOLHDL 3 11/18/2012   No results found for: "LDLDIRECT"    Radiology: No results found.  EKG: ***   ASSESSMENT AND PLAN:   CAD/CABG:  CABG 2010 see above LIMA LAD, SVG D2 Normal EF by TTE 2021 Active with no agnina Continue ASA, lopressor and statin *** HLD:  labs with Dr [name] on crestor and zetia DM:  Discussed low carb diet.   Target hemoglobin A1c is 6.5 or less.  Continue current medications. HTN:  continue ACE and DASH type diet   ***  F/U in a year   Signed: Charlton Haws 06/20/2023, 11:21 AM

## 2023-06-25 ENCOUNTER — Ambulatory Visit: Payer: Medicare Other | Admitting: Cardiovascular Disease

## 2023-07-16 DIAGNOSIS — I2510 Atherosclerotic heart disease of native coronary artery without angina pectoris: Secondary | ICD-10-CM | POA: Diagnosis not present

## 2023-07-16 DIAGNOSIS — Z951 Presence of aortocoronary bypass graft: Secondary | ICD-10-CM | POA: Diagnosis not present

## 2023-07-16 DIAGNOSIS — E782 Mixed hyperlipidemia: Secondary | ICD-10-CM | POA: Diagnosis not present

## 2023-07-17 ENCOUNTER — Ambulatory Visit: Payer: Medicare Other | Admitting: Cardiology

## 2023-07-18 DIAGNOSIS — S0003XA Contusion of scalp, initial encounter: Secondary | ICD-10-CM | POA: Diagnosis not present

## 2023-11-27 DIAGNOSIS — Z135 Encounter for screening for eye and ear disorders: Secondary | ICD-10-CM | POA: Diagnosis not present

## 2023-11-27 DIAGNOSIS — E119 Type 2 diabetes mellitus without complications: Secondary | ICD-10-CM | POA: Diagnosis not present

## 2023-11-27 DIAGNOSIS — H2513 Age-related nuclear cataract, bilateral: Secondary | ICD-10-CM | POA: Diagnosis not present

## 2023-12-05 DIAGNOSIS — E119 Type 2 diabetes mellitus without complications: Secondary | ICD-10-CM | POA: Diagnosis not present

## 2023-12-05 DIAGNOSIS — I10 Essential (primary) hypertension: Secondary | ICD-10-CM | POA: Diagnosis not present

## 2023-12-11 DIAGNOSIS — I10 Essential (primary) hypertension: Secondary | ICD-10-CM | POA: Diagnosis not present

## 2023-12-11 DIAGNOSIS — E1169 Type 2 diabetes mellitus with other specified complication: Secondary | ICD-10-CM | POA: Diagnosis not present

## 2023-12-14 DIAGNOSIS — H2513 Age-related nuclear cataract, bilateral: Secondary | ICD-10-CM | POA: Diagnosis not present

## 2023-12-14 DIAGNOSIS — H40013 Open angle with borderline findings, low risk, bilateral: Secondary | ICD-10-CM | POA: Diagnosis not present

## 2023-12-14 DIAGNOSIS — H2512 Age-related nuclear cataract, left eye: Secondary | ICD-10-CM | POA: Diagnosis not present

## 2023-12-14 DIAGNOSIS — I10 Essential (primary) hypertension: Secondary | ICD-10-CM | POA: Diagnosis not present

## 2023-12-14 DIAGNOSIS — E119 Type 2 diabetes mellitus without complications: Secondary | ICD-10-CM | POA: Diagnosis not present

## 2024-02-11 DIAGNOSIS — H2513 Age-related nuclear cataract, bilateral: Secondary | ICD-10-CM | POA: Diagnosis not present

## 2024-02-11 DIAGNOSIS — H11153 Pinguecula, bilateral: Secondary | ICD-10-CM | POA: Diagnosis not present

## 2024-02-11 DIAGNOSIS — H25042 Posterior subcapsular polar age-related cataract, left eye: Secondary | ICD-10-CM | POA: Diagnosis not present

## 2024-02-11 DIAGNOSIS — E119 Type 2 diabetes mellitus without complications: Secondary | ICD-10-CM | POA: Diagnosis not present

## 2024-03-10 DIAGNOSIS — Z951 Presence of aortocoronary bypass graft: Secondary | ICD-10-CM | POA: Diagnosis not present

## 2024-03-10 DIAGNOSIS — I2510 Atherosclerotic heart disease of native coronary artery without angina pectoris: Secondary | ICD-10-CM | POA: Diagnosis not present

## 2024-03-10 DIAGNOSIS — E782 Mixed hyperlipidemia: Secondary | ICD-10-CM | POA: Diagnosis not present

## 2024-03-10 DIAGNOSIS — I10 Essential (primary) hypertension: Secondary | ICD-10-CM | POA: Diagnosis not present

## 2024-03-10 DIAGNOSIS — E119 Type 2 diabetes mellitus without complications: Secondary | ICD-10-CM | POA: Diagnosis not present

## 2024-03-12 DIAGNOSIS — I10 Essential (primary) hypertension: Secondary | ICD-10-CM | POA: Diagnosis not present

## 2024-03-12 DIAGNOSIS — E1169 Type 2 diabetes mellitus with other specified complication: Secondary | ICD-10-CM | POA: Diagnosis not present

## 2024-03-27 DIAGNOSIS — H2181 Floppy iris syndrome: Secondary | ICD-10-CM | POA: Diagnosis not present

## 2024-03-27 DIAGNOSIS — I10 Essential (primary) hypertension: Secondary | ICD-10-CM | POA: Diagnosis not present

## 2024-03-27 DIAGNOSIS — H25812 Combined forms of age-related cataract, left eye: Secondary | ICD-10-CM | POA: Diagnosis not present

## 2024-03-27 DIAGNOSIS — H5703 Miosis: Secondary | ICD-10-CM | POA: Diagnosis not present

## 2024-03-27 DIAGNOSIS — H2512 Age-related nuclear cataract, left eye: Secondary | ICD-10-CM | POA: Diagnosis not present

## 2024-03-28 DIAGNOSIS — Z961 Presence of intraocular lens: Secondary | ICD-10-CM | POA: Diagnosis not present

## 2024-03-28 DIAGNOSIS — H2511 Age-related nuclear cataract, right eye: Secondary | ICD-10-CM | POA: Diagnosis not present

## 2024-04-04 DIAGNOSIS — H2511 Age-related nuclear cataract, right eye: Secondary | ICD-10-CM | POA: Diagnosis not present

## 2024-04-17 DIAGNOSIS — H5703 Miosis: Secondary | ICD-10-CM | POA: Diagnosis not present

## 2024-04-17 DIAGNOSIS — I10 Essential (primary) hypertension: Secondary | ICD-10-CM | POA: Diagnosis not present

## 2024-04-17 DIAGNOSIS — H2511 Age-related nuclear cataract, right eye: Secondary | ICD-10-CM | POA: Diagnosis not present

## 2024-04-17 DIAGNOSIS — H25811 Combined forms of age-related cataract, right eye: Secondary | ICD-10-CM | POA: Diagnosis not present

## 2024-04-18 DIAGNOSIS — Z961 Presence of intraocular lens: Secondary | ICD-10-CM | POA: Diagnosis not present

## 2024-04-23 DIAGNOSIS — Z961 Presence of intraocular lens: Secondary | ICD-10-CM | POA: Diagnosis not present

## 2024-05-13 DIAGNOSIS — H524 Presbyopia: Secondary | ICD-10-CM | POA: Diagnosis not present

## 2024-05-13 DIAGNOSIS — H40003 Preglaucoma, unspecified, bilateral: Secondary | ICD-10-CM | POA: Diagnosis not present

## 2024-05-13 DIAGNOSIS — Z961 Presence of intraocular lens: Secondary | ICD-10-CM | POA: Diagnosis not present

## 2024-07-07 DIAGNOSIS — E1169 Type 2 diabetes mellitus with other specified complication: Secondary | ICD-10-CM | POA: Diagnosis not present

## 2024-07-16 NOTE — Progress Notes (Signed)
 [name]                                          MRN: [id number]   07/16/2024   The [facility] Quality Team Specialist reviewed this patient medical record for the purposes of chart review for care gap closure. The following were reviewed: chart review for care gap closure-kidney health evaluation for diabetes:uACR.    [facility] Quality Team

## 2024-07-18 DIAGNOSIS — E1169 Type 2 diabetes mellitus with other specified complication: Secondary | ICD-10-CM | POA: Diagnosis not present

## 2024-07-18 DIAGNOSIS — I10 Essential (primary) hypertension: Secondary | ICD-10-CM | POA: Diagnosis not present

## 2024-07-18 DIAGNOSIS — R82998 Other abnormal findings in urine: Secondary | ICD-10-CM | POA: Diagnosis not present

## 2024-07-18 DIAGNOSIS — Z23 Encounter for immunization: Secondary | ICD-10-CM | POA: Diagnosis not present

## 2024-07-18 DIAGNOSIS — Z0000 Encounter for general adult medical examination without abnormal findings: Secondary | ICD-10-CM | POA: Diagnosis not present
